# Patient Record
Sex: Female | Born: 1970 | Race: White | Hispanic: No | Marital: Married | Smoking: Never smoker
Health system: Southern US, Community
[De-identification: ages and names within clinical notes are randomized; demographics above are authoritative.]

## PROBLEM LIST (undated history)

## (undated) DIAGNOSIS — F329 Major depressive disorder, single episode, unspecified: Secondary | ICD-10-CM

## (undated) DIAGNOSIS — R7303 Prediabetes: Secondary | ICD-10-CM

## (undated) DIAGNOSIS — T8859XA Other complications of anesthesia, initial encounter: Secondary | ICD-10-CM

## (undated) DIAGNOSIS — F419 Anxiety disorder, unspecified: Secondary | ICD-10-CM

## (undated) DIAGNOSIS — M2550 Pain in unspecified joint: Secondary | ICD-10-CM

## (undated) DIAGNOSIS — E559 Vitamin D deficiency, unspecified: Secondary | ICD-10-CM

## (undated) DIAGNOSIS — K5900 Constipation, unspecified: Secondary | ICD-10-CM

## (undated) DIAGNOSIS — K219 Gastro-esophageal reflux disease without esophagitis: Secondary | ICD-10-CM

## (undated) DIAGNOSIS — F32A Depression, unspecified: Secondary | ICD-10-CM

## (undated) DIAGNOSIS — M549 Dorsalgia, unspecified: Secondary | ICD-10-CM

## (undated) DIAGNOSIS — K829 Disease of gallbladder, unspecified: Secondary | ICD-10-CM

## (undated) DIAGNOSIS — M1990 Unspecified osteoarthritis, unspecified site: Secondary | ICD-10-CM

## (undated) DIAGNOSIS — M25559 Pain in unspecified hip: Secondary | ICD-10-CM

## (undated) DIAGNOSIS — F319 Bipolar disorder, unspecified: Secondary | ICD-10-CM

## (undated) HISTORY — PX: ABDOMINAL HYSTERECTOMY: SHX81

## (undated) HISTORY — DX: Prediabetes: R73.03

## (undated) HISTORY — PX: TONSILLECTOMY: SUR1361

## (undated) HISTORY — PX: TUBAL LIGATION: SHX77

## (undated) HISTORY — DX: Constipation, unspecified: K59.00

## (undated) HISTORY — DX: Unspecified osteoarthritis, unspecified site: M19.90

## (undated) HISTORY — DX: Depression, unspecified: F32.A

## (undated) HISTORY — DX: Major depressive disorder, single episode, unspecified: F32.9

## (undated) HISTORY — DX: Pain in unspecified hip: M25.559

## (undated) HISTORY — DX: Bipolar disorder, unspecified: F31.9

## (undated) HISTORY — DX: Anxiety disorder, unspecified: F41.9

## (undated) HISTORY — DX: Vitamin D deficiency, unspecified: E55.9

## (undated) HISTORY — DX: Disease of gallbladder, unspecified: K82.9

## (undated) HISTORY — DX: Dorsalgia, unspecified: M54.9

## (undated) HISTORY — DX: Gastro-esophageal reflux disease without esophagitis: K21.9

## (undated) HISTORY — PX: NECK SURGERY: SHX720

## (undated) HISTORY — PX: CHOLECYSTECTOMY: SHX55

## (undated) HISTORY — DX: Pain in unspecified joint: M25.50

---

## 1998-11-26 ENCOUNTER — Emergency Department (HOSPITAL_COMMUNITY): Admission: EM | Admit: 1998-11-26 | Discharge: 1998-11-26 | Payer: Self-pay | Admitting: Emergency Medicine

## 2000-08-14 ENCOUNTER — Encounter: Payer: Self-pay | Admitting: Family Medicine

## 2000-08-14 ENCOUNTER — Ambulatory Visit (HOSPITAL_COMMUNITY): Admission: RE | Admit: 2000-08-14 | Discharge: 2000-08-14 | Payer: Self-pay | Admitting: Family Medicine

## 2001-02-05 ENCOUNTER — Emergency Department (HOSPITAL_COMMUNITY): Admission: EM | Admit: 2001-02-05 | Discharge: 2001-02-06 | Payer: Self-pay | Admitting: *Deleted

## 2002-03-22 ENCOUNTER — Emergency Department (HOSPITAL_COMMUNITY): Admission: EM | Admit: 2002-03-22 | Discharge: 2002-03-22 | Payer: Self-pay | Admitting: Emergency Medicine

## 2002-11-03 ENCOUNTER — Encounter: Payer: Self-pay | Admitting: Emergency Medicine

## 2002-11-03 ENCOUNTER — Emergency Department (HOSPITAL_COMMUNITY): Admission: EM | Admit: 2002-11-03 | Discharge: 2002-11-03 | Payer: Self-pay | Admitting: Emergency Medicine

## 2002-11-24 ENCOUNTER — Ambulatory Visit (HOSPITAL_COMMUNITY): Admission: RE | Admit: 2002-11-24 | Discharge: 2002-11-24 | Payer: Self-pay | Admitting: Family Medicine

## 2002-11-24 ENCOUNTER — Encounter: Payer: Self-pay | Admitting: Family Medicine

## 2003-01-14 ENCOUNTER — Ambulatory Visit (HOSPITAL_COMMUNITY): Admission: RE | Admit: 2003-01-14 | Discharge: 2003-01-14 | Payer: Self-pay | Admitting: Gastroenterology

## 2003-06-28 ENCOUNTER — Emergency Department (HOSPITAL_COMMUNITY): Admission: EM | Admit: 2003-06-28 | Discharge: 2003-06-29 | Payer: Self-pay | Admitting: Emergency Medicine

## 2003-10-01 ENCOUNTER — Encounter: Admission: RE | Admit: 2003-10-01 | Discharge: 2003-10-01 | Payer: Self-pay | Admitting: Family Medicine

## 2005-05-09 ENCOUNTER — Emergency Department (HOSPITAL_COMMUNITY): Admission: EM | Admit: 2005-05-09 | Discharge: 2005-05-10 | Payer: Self-pay | Admitting: *Deleted

## 2005-11-26 ENCOUNTER — Encounter: Admission: RE | Admit: 2005-11-26 | Discharge: 2005-11-26 | Payer: Self-pay | Admitting: Family Medicine

## 2006-05-27 ENCOUNTER — Other Ambulatory Visit: Admission: RE | Admit: 2006-05-27 | Discharge: 2006-05-27 | Payer: Self-pay | Admitting: Family Medicine

## 2006-08-23 ENCOUNTER — Emergency Department (HOSPITAL_COMMUNITY): Admission: EM | Admit: 2006-08-23 | Discharge: 2006-08-23 | Payer: Self-pay | Admitting: Emergency Medicine

## 2007-03-31 ENCOUNTER — Emergency Department (HOSPITAL_COMMUNITY): Admission: EM | Admit: 2007-03-31 | Discharge: 2007-04-01 | Payer: Self-pay | Admitting: Emergency Medicine

## 2007-04-28 ENCOUNTER — Encounter: Admission: RE | Admit: 2007-04-28 | Discharge: 2007-04-28 | Payer: Self-pay | Admitting: Gastroenterology

## 2008-02-20 ENCOUNTER — Encounter: Admission: RE | Admit: 2008-02-20 | Discharge: 2008-02-20 | Payer: Self-pay | Admitting: Family Medicine

## 2008-06-22 ENCOUNTER — Ambulatory Visit (HOSPITAL_COMMUNITY): Admission: RE | Admit: 2008-06-22 | Discharge: 2008-06-22 | Payer: Self-pay | Admitting: Gastroenterology

## 2008-08-20 ENCOUNTER — Encounter (INDEPENDENT_AMBULATORY_CARE_PROVIDER_SITE_OTHER): Payer: Self-pay | Admitting: General Surgery

## 2008-08-20 ENCOUNTER — Ambulatory Visit (HOSPITAL_COMMUNITY): Admission: RE | Admit: 2008-08-20 | Discharge: 2008-08-20 | Payer: Self-pay | Admitting: General Surgery

## 2010-02-26 ENCOUNTER — Encounter: Payer: Self-pay | Admitting: Family Medicine

## 2010-05-02 ENCOUNTER — Emergency Department (HOSPITAL_COMMUNITY)
Admission: EM | Admit: 2010-05-02 | Discharge: 2010-05-02 | Disposition: A | Discharge status: Home / Self Care | Payer: Self-pay | Attending: Emergency Medicine | Admitting: Emergency Medicine

## 2010-05-02 ENCOUNTER — Emergency Department (HOSPITAL_COMMUNITY): Payer: Self-pay

## 2010-05-02 DIAGNOSIS — R0602 Shortness of breath: Secondary | ICD-10-CM | POA: Insufficient documentation

## 2010-05-02 DIAGNOSIS — R0989 Other specified symptoms and signs involving the circulatory and respiratory systems: Secondary | ICD-10-CM | POA: Insufficient documentation

## 2010-05-02 DIAGNOSIS — Z79899 Other long term (current) drug therapy: Secondary | ICD-10-CM | POA: Insufficient documentation

## 2010-05-02 DIAGNOSIS — F411 Generalized anxiety disorder: Secondary | ICD-10-CM | POA: Insufficient documentation

## 2010-05-02 DIAGNOSIS — R110 Nausea: Secondary | ICD-10-CM | POA: Insufficient documentation

## 2010-05-02 DIAGNOSIS — R079 Chest pain, unspecified: Secondary | ICD-10-CM | POA: Insufficient documentation

## 2010-05-02 DIAGNOSIS — R0609 Other forms of dyspnea: Secondary | ICD-10-CM | POA: Insufficient documentation

## 2010-05-02 DIAGNOSIS — F319 Bipolar disorder, unspecified: Secondary | ICD-10-CM | POA: Insufficient documentation

## 2010-05-02 LAB — POCT CARDIAC MARKERS
CKMB, poc: <1 ng/mL — ABNORMAL LOW (ref 1.0–8.0)
Myoglobin, poc: 99.8 ng/mL (ref 12–200)
Troponin i, poc: <0.05 ng/mL (ref 0.00–0.09)

## 2010-05-02 LAB — DIFFERENTIAL
Basophils Absolute: 0 10*3/uL (ref 0.0–0.1)
Neutro Abs: 5.6 10*3/uL (ref 1.7–7.7)
Neutrophils Relative %: 66 % (ref 43–77)

## 2010-05-02 LAB — POCT I-STAT, CHEM 8
BUN: 6 mg/dL (ref 6–23)
Calcium, Ion: 1.17 mmol/L (ref 1.12–1.32)
Chloride: 104 mEq/L (ref 96–112)
Creatinine, Ser: 0.9 mg/dL (ref 0.4–1.2)
HCT: 43 % (ref 36.0–46.0)
Hemoglobin: 14.6 g/dL (ref 12.0–15.0)
TCO2: 25 mmol/L (ref 0–100)

## 2010-05-02 LAB — CBC
MCH: 28.9 pg (ref 26.0–34.0)
MCV: 85.1 fL (ref 78.0–100.0)
Platelets: 229 10*3/uL (ref 150–400)

## 2010-05-14 LAB — HEMOGLOBIN AND HEMATOCRIT, BLOOD: HCT: 38.4 % (ref 36.0–46.0)

## 2010-06-20 NOTE — Op Note (Signed)
NAMESYMONE, CORNMAN              ACCOUNT NO.:  [id number]   MEDICAL RECORD NO.:  [id number]          PATIENT TYPE:  AMB   LOCATION:  DAY                          FACILITY:  [facility]   PHYSICIAN:  Lennie Muckle, MD      DATE OF BIRTH:  1970-08-03   DATE OF PROCEDURE:  08/20/2008  DATE OF DISCHARGE:                               OPERATIVE REPORT   PREOPERATIVE DIAGNOSIS:  Biliary dyskinesia.   POSTOPERATIVE DIAGNOSIS:  Biliary dyskinesia.   PROCEDURE:  Laparoscopic cholecystectomy.   SURGEON:  Lennie Muckle, M.D.   ASSISTANT:  OR staff.   FINDINGS:  Adhesions to the gallbladder.   SPECIMEN:  Gallbladder.   ESTIMATED BLOOD LOSS:  Minimal amount of blood loss.   COMPLICATIONS:  No immediate complications.   INDICATIONS FOR PROCEDURE:  Christy Le is a 40 year old female who had  epigastric right upper quadrant pain for several years.  She had had an  endoscopy which was essentially normal except for mild gastritis.  Workup revealed an ultrasound with no stones with a HIDA scan with  ejection fraction of 27%.  Symptoms have become slightly more frequent  in nature.  They did seem consistent with biliary dyskinesia.  I  discussed with Christy Le that I thought she would benefit from a  cholecystectomy with the risk only 10%, the patient __________  responding to removal of the gallbladder.  All questions were answered.  Informed consent was obtained prior to procedure.   DESCRIPTION OF PROCEDURE:  Christy Le was identified in the preoperative  holding area.  She was then taken to the operating room.  Once in the  operating room,  placed in supine position.  After administration of  general endotracheal anesthesia, SCDs were applied to her lower  extremity.  Her abdomen was then prepped and draped in the usual sterile  fashion.  A time-out procedure and identification of patient and  procedure were performed.  I placed an incision in the right upper  quadrant using the 5 mm  trocar in the Optivus, placed the trocar into  the abdominal cavity.  All layers of abdominal wall were visualized upon  entry.  After insufflating the abdomen, I inspected the abdomen.  I  found no evidence of injury upon placement of the trocar.  I then looked  towards the umbilical region where she had had a previous incision from  her tubal ligation.  No adhesions were found here.  I then placed 11 mm  trocar under direct visualization with the camera.  I then moved the  camera in the umbilical region, placed two additional trocars in the  right upper quadrant under visualization with the camera.  Gallbladder  was identified in the normal anatomic position.  I grasped the fundus,  retracted this to this to the head of the patient.  Retracted the  infundibulum away from the liver bed using the Maryland forceps  electrocautery, dissected the cystic duct and artery to obtain a  critical view.  Once a critical view was obtained. I clipped and divided  the cystic duct and artery without difficulty.  The remaining peritoneal  attachments removed with electrocautery.  The gallbladder was placed in  an EndoCatch bag and removed from the abdomen.  I inspected the liver  bed and found no evidence of bleeding.  I closed the fascial defect at  the umbilicus using an 0 Vicryl suture and laparoscopic suture passer.  I then inspected the abdomen and found no evidence of bleeding.  No  evidence of injury.  Thus removed the pneumoperitoneum, removed the  trocar and closed the skin with 4-0 Monocryl.  Dermabond was placed as  final dressing.  The patient was then extubated, transferred to post  anesthesia care unit in stable condition.  She will be monitored for a  short while and likely discharged home later today.   She will be given #48 Percocet for pain and follow up with me in 2 to 3  weeks.   __________      Lennie Muckle, MD  Electronically Signed     ALA/MEDQ  D:  08/20/2008  T:   08/20/2008  Job:  [id number]

## 2010-06-23 NOTE — Op Note (Signed)
NAME:  Christy Le, Christy Le                        ACCOUNT NO.:  [id number]   MEDICAL RECORD NO.:  [id number]                   PATIENT TYPE:  AMB   LOCATION:  ENDO                                 FACILITY:  [facility]   PHYSICIAN:  Graylin Shiver, M.D.                DATE OF BIRTH:  [birth year]   DATE OF PROCEDURE:  01/14/2003  DATE OF DISCHARGE:                                 OPERATIVE REPORT   PROCEDURE:  Esophagogastroduodenoscopy with biopsy for CLOtest.   INDICATIONS FOR PROCEDURE:  Upper abdominal pain.   Informed consent was obtained after explanation of the risks of bleeding,  infection and perforation.   PREMEDICATION:  Fentanyl 50 mcg IV, Versed 7 mg IV.   DESCRIPTION OF PROCEDURE:  With the patient in the left lateral decubitus  position, the Olympus gastroscope was inserted into the oropharynx and  passed into the esophagus.  It was advanced down the esophagus and then into  the stomach and into the duodenum.  The second portion and bulb of the  duodenum looked normal.  The stomach showed some erythema in the antrum  compatible with antral gastritis.  Biopsies for CLOtest were obtained.  The  body of the stomach looked normal, the fundus and cardia looked normal.  The  esophagus looked normal in its entirety with the esophagogastric junction  being at 39 cm.  She tolerated the procedure well without complications.   IMPRESSION:  Mild antral gastritis.   PLAN:  The CLOtest will be checked and if positive it will be treated.                                               Graylin Shiver, M.D.    Christy Le  D:  01/14/2003  T:  01/14/2003  Job:  [id number]   cc:   Meredith Staggers, M.D.  [address]  [place]  [place] [address]  Fax: [phone]

## 2010-07-14 ENCOUNTER — Other Ambulatory Visit: Payer: Self-pay | Admitting: Physician Assistant

## 2010-07-14 ENCOUNTER — Other Ambulatory Visit (HOSPITAL_COMMUNITY)
Admission: RE | Admit: 2010-07-14 | Discharge: 2010-07-14 | Disposition: A | Discharge status: Home / Self Care | Payer: Self-pay | Source: Ambulatory Visit | Attending: Family Medicine | Admitting: Family Medicine

## 2010-07-14 DIAGNOSIS — Z01419 Encounter for gynecological examination (general) (routine) without abnormal findings: Secondary | ICD-10-CM | POA: Insufficient documentation

## 2010-07-14 DIAGNOSIS — Z113 Encounter for screening for infections with a predominantly sexual mode of transmission: Secondary | ICD-10-CM | POA: Insufficient documentation

## 2010-10-30 LAB — CBC
MCHC: 34.2
MCV: 83.9
RBC: 4.77
RDW: 13.2
WBC: 11.8 — ABNORMAL HIGH

## 2010-10-30 LAB — URINALYSIS, ROUTINE W REFLEX MICROSCOPIC
Bilirubin Urine: NEGATIVE
Glucose, UA: NEGATIVE
Hgb urine dipstick: NEGATIVE
Ketones, ur: 15 — AB
Nitrite: NEGATIVE
Protein, ur: NEGATIVE
Specific Gravity, Urine: 1.014
Urobilinogen, UA: 0.2
pH: 7

## 2010-10-30 LAB — I-STAT 8, (EC8 V) (CONVERTED LAB)
Acid-Base Excess: 1
Bicarbonate: 26.4 — ABNORMAL HIGH
Hemoglobin: 15
Potassium: 3.8
Sodium: 138
pCO2, Ven: 45.1

## 2010-10-30 LAB — PREGNANCY, URINE: Preg Test, Ur: NEGATIVE

## 2010-10-30 LAB — DIFFERENTIAL
Basophils Absolute: 0.1
Eosinophils Relative: 1
Monocytes Relative: 8
Neutro Abs: 8.3 — ABNORMAL HIGH
Neutrophils Relative %: 70

## 2010-10-30 LAB — POCT I-STAT CREATININE: Creatinine, Ser: 1.1

## 2010-10-30 LAB — POCT CARDIAC MARKERS: CKMB, poc: <1 — ABNORMAL LOW

## 2011-02-16 ENCOUNTER — Emergency Department (HOSPITAL_BASED_OUTPATIENT_CLINIC_OR_DEPARTMENT_OTHER)
Admission: EM | Admit: 2011-02-16 | Discharge: 2011-02-16 | Disposition: A | Discharge status: Home / Self Care | Payer: Self-pay | Attending: Emergency Medicine | Admitting: Emergency Medicine

## 2011-02-16 ENCOUNTER — Emergency Department (INDEPENDENT_AMBULATORY_CARE_PROVIDER_SITE_OTHER): Payer: Self-pay

## 2011-02-16 ENCOUNTER — Encounter (HOSPITAL_BASED_OUTPATIENT_CLINIC_OR_DEPARTMENT_OTHER): Payer: Self-pay | Admitting: *Deleted

## 2011-02-16 DIAGNOSIS — R05 Cough: Secondary | ICD-10-CM | POA: Insufficient documentation

## 2011-02-16 DIAGNOSIS — R059 Cough, unspecified: Secondary | ICD-10-CM | POA: Insufficient documentation

## 2011-02-16 DIAGNOSIS — R509 Fever, unspecified: Secondary | ICD-10-CM

## 2011-02-16 DIAGNOSIS — R0989 Other specified symptoms and signs involving the circulatory and respiratory systems: Secondary | ICD-10-CM

## 2011-02-16 DIAGNOSIS — J40 Bronchitis, not specified as acute or chronic: Secondary | ICD-10-CM | POA: Insufficient documentation

## 2011-02-16 MED ORDER — ALBUTEROL SULFATE HFA 108 (90 BASE) MCG/ACT IN AERS: 1.0000 | INHALATION_SPRAY | Freq: Four times a day (QID) | RESPIRATORY_TRACT | Status: DC | PRN

## 2011-02-16 MED ORDER — HYDROCOD POLST-CHLORPHEN POLST 10-8 MG/5ML PO LQCR: 5.0000 mL | Freq: Two times a day (BID) | ORAL | Status: DC

## 2011-02-16 MED ORDER — AZITHROMYCIN 250 MG PO TABS: 250.0000 mg | ORAL_TABLET | Freq: Every day | ORAL | Status: AC

## 2011-02-16 NOTE — ED Provider Notes (Signed)
History     CSN: [id number]  Arrival date & time 02/16/11  1407   First MD Initiated Contact with Patient 02/16/11 1449      Chief Complaint  Patient presents with  . URI    (Consider location/radiation/quality/duration/timing/severity/associated sxs/prior treatment) Patient is a 41 y.o. female presenting with cough. The history is provided by the patient. No language interpreter was used.  Cough This is a new problem. The current episode started more than 1 week ago. The problem occurs constantly. The problem has been gradually worsening. The cough is productive of sputum. The maximum temperature recorded prior to her arrival was 100 to 100.9 F. The fever has been present for 1 to 2 days. Associated symptoms include chest pain, chills, ear congestion, ear pain, headaches, rhinorrhea, sore throat and myalgias. She has tried decongestants for the symptoms. The treatment provided no relief. Risk factors: no flu shot. Her past medical history does not include pneumonia.    History reviewed. No pertinent past medical history.  Past Surgical History  Procedure Date  . Cholecystectomy   . Tubal ligation     History reviewed. No pertinent family history.  History  Substance Use Topics  . Smoking status: Never Smoker   . Smokeless tobacco: Not on file  . Alcohol Use: No    OB History    Grav Para Term Preterm Abortions TAB SAB Ect Mult Living                  Review of Systems  Constitutional: Positive for chills.  HENT: Positive for ear pain, sore throat and rhinorrhea.   Respiratory: Positive for cough.   Cardiovascular: Positive for chest pain.  Musculoskeletal: Positive for myalgias.  Neurological: Positive for headaches.  All other systems reviewed and are negative.    Allergies  Review of patient's allergies indicates no known allergies.  Home Medications  No current outpatient prescriptions on file.  BP 117/66  Pulse 100  Temp(Src) 100.3 F (37.9 C)  (Oral)  Resp 18  Ht 5\' 9"  (1.753 m)  Wt 195 lb (88.451 kg)  BMI 28.80 kg/m2  SpO2 100%  LMP 02/06/2011  Physical Exam  Vitals reviewed. Constitutional: She is oriented to person, place, and time. She appears well-developed and well-nourished.  HENT:  Head: Normocephalic and atraumatic.  Right Ear: External ear normal.  Left Ear: External ear normal.  Nose: Nose normal.  Mouth/Throat: Oropharynx is clear and moist.  Eyes: EOM are normal. Pupils are equal, round, and reactive to light.  Neck: Neck supple.  Cardiovascular: Normal rate and normal heart sounds.   Pulmonary/Chest: Effort normal.  Abdominal: Soft. Bowel sounds are normal.  Musculoskeletal: Normal range of motion.  Neurological: She is alert and oriented to person, place, and time. She has normal reflexes.  Skin: Skin is warm.  Psychiatric: She has a normal mood and affect.    ED Course  Procedures (including critical care time)  Labs Reviewed - No data to display Dg Chest 2 View  02/16/2011  *RADIOLOGY REPORT*  Clinical Data: Cough, congestion, fever.  CHEST - 2 VIEW  Comparison: 05/02/2010  Findings: Heart and mediastinal contours are within normal limits. No focal opacities or effusions.  No acute bony abnormality.  IMPRESSION: No active cardiopulmonary disease.  Original Report Authenticated By: Cyndie Chime, M.D.     No diagnosis found.    MDM  I will treat for bronchitis.  Pt given rx for zithromax and tussionex.   Pt given albuterol inhaler  here.        Langston Masker, PA 02/16/11 1502

## 2011-02-16 NOTE — ED Notes (Signed)
Pt c/o flu like symptoms x  5 days 

## 2011-02-17 NOTE — ED Provider Notes (Signed)
Medical screening examination/treatment/procedure(s) were performed by non-physician practitioner and as supervising physician I was immediately available for consultation/collaboration.  Mardie Kellen R. Kendre Sires, MD 02/17/11 0715 

## 2013-07-07 ENCOUNTER — Encounter (INDEPENDENT_AMBULATORY_CARE_PROVIDER_SITE_OTHER): Payer: Self-pay

## 2013-07-07 ENCOUNTER — Ambulatory Visit (INDEPENDENT_AMBULATORY_CARE_PROVIDER_SITE_OTHER): Payer: BC Managed Care – PPO | Admitting: Internal Medicine

## 2013-07-07 ENCOUNTER — Encounter: Payer: Self-pay | Admitting: Internal Medicine

## 2013-07-07 VITALS — BP 114/74 | HR 68 | Temp 98.8°F | Ht 68.33 in | Wt 205.0 lb

## 2013-07-07 DIAGNOSIS — A499 Bacterial infection, unspecified: Secondary | ICD-10-CM

## 2013-07-07 DIAGNOSIS — M129 Arthropathy, unspecified: Secondary | ICD-10-CM

## 2013-07-07 DIAGNOSIS — N760 Acute vaginitis: Secondary | ICD-10-CM

## 2013-07-07 DIAGNOSIS — M1990 Unspecified osteoarthritis, unspecified site: Secondary | ICD-10-CM

## 2013-07-07 DIAGNOSIS — B9689 Other specified bacterial agents as the cause of diseases classified elsewhere: Secondary | ICD-10-CM

## 2013-07-07 DIAGNOSIS — K219 Gastro-esophageal reflux disease without esophagitis: Secondary | ICD-10-CM

## 2013-07-07 DIAGNOSIS — Z0000 Encounter for general adult medical examination without abnormal findings: Secondary | ICD-10-CM

## 2013-07-07 MED ORDER — METRONIDAZOLE 0.75 % VA GEL: 1.0000 | Freq: Two times a day (BID) | VAGINAL | Status: DC

## 2013-07-07 NOTE — Progress Notes (Signed)
Pre visit review using our clinic review tool, if applicable. No additional management support is needed unless otherwise documented below in the visit note. 

## 2013-07-07 NOTE — Patient Instructions (Addendum)

## 2013-07-07 NOTE — Progress Notes (Addendum)
HPI  Pt presents to the clinic today to establish care. She has not had a PCP in many years. She does have some concerns today.  1- Pain in the joint on her left hand. Feels very stiff in the morning. Achy and throbbing at times. She has not tried anything OTC.  2- GERD. She knows which foods trigger her reflux. She does try to avoid them. She has gained 20 lbs in the last year.  3- Vaginal odor. Started a few weeks ago. She does not take bubble baths or douche. She has not tried anything OTC.  Flu: never Tetanus:: unsure LMP: Pap Smear: 2012 Mammogram: more than 2 years ago Eye Doctor: yearly Dentist: as needed  Past Medical History  Diagnosis Date  . Arthritis   . GERD (gastroesophageal reflux disease)   . Depression     Current Outpatient Prescriptions  Medication Sig Dispense Refill  . aspirin 81 MG tablet Take 81 mg by mouth daily.      . Glucosamine-Chondroit-Vit C-Mn (GLUCOSAMINE 1500 COMPLEX PO) Take 1 capsule by mouth daily.      . Multiple Vitamin (MULTIVITAMIN) capsule Take 1 capsule by mouth daily.       No current facility-administered medications for this visit.    No Known Allergies  Family History  Problem Relation Age of Onset  . Hypertension Mother   . Mental retardation Mother   . Heart disease Father   . Congestive Heart Failure Father   . Cancer Maternal Grandfather     Lung  . Hypertension Maternal Grandfather   . Heart disease Paternal Grandfather   . Hypertension Paternal Grandfather     History   Social History  . Marital Status: Married    Spouse Name: N/A    Number of Children: N/A  . Years of Education: N/A   Occupational History  . Not on file.   Social History Main Topics  . Smoking status: Never Smoker   . Smokeless tobacco: Not on file  . Alcohol Use: Yes     Comment: occasional  . Drug Use: No  . Sexual Activity: Yes    Birth Control/ Protection: Surgical   Other Topics Concern  . Not on file   Social History  Narrative  . No narrative on file    ROS:  Constitutional: Denies fever, malaise, fatigue, headache or abrupt weight changes.  HEENT: Denies eye pain, eye redness, ear pain, ringing in the ears, wax buildup, runny nose, nasal congestion, bloody nose, or sore throat. Respiratory: Denies difficulty breathing, shortness of breath, cough or sputum production.   Cardiovascular: Denies chest pain, chest tightness, palpitations or swelling in the hands or feet.  Gastrointestinal: Pt reports reflux. Denies abdominal pain, bloating, constipation, diarrhea or blood in the stool.  GU: Pt reports vaginal odor. Denies frequency, urgency, pain with urination, blood in urine, or discharge. Musculoskeletal: Pt reports joint pain and swelling. Denies decrease in range of motion, difficulty with gait, muscle pain.  Skin: Denies redness, rashes, lesions or ulcercations.  Neurological: Denies dizziness, difficulty with memory, difficulty with speech or problems with balance and coordination.   No other specific complaints in a complete review of systems (except as listed in HPI above).  PE:  BP 114/74  Pulse 68  Temp(Src) 98.8 F (37.1 C) (Oral)  Ht 5' 8.33" (1.736 m)  Wt 205 lb (92.987 kg)  BMI 30.85 kg/m2  SpO2 99%  LMP 06/22/2013 Wt Readings from Last 3 Encounters:  07/07/13 205 lb (92.987  kg)  02/16/11 195 lb (88.451 kg)    General: Appears her stated age, overweight but well developed, well nourished in NAD. HEENT: Head: normal shape and size; Eyes: sclera white, no icterus, conjunctiva pink, PERRLA and EOMs intact; Ears: Tm's gray and intact, normal light reflex; Nose: mucosa pink and moist, septum midline; Throat/Mouth: Teeth present, mucosa pink and moist, no lesions or ulcerations noted.  Neck: Normal range of motion. Neck supple, trachea midline. No massses, lumps or thyromegaly present.  Cardiovascular: Normal rate and rhythm. S1,S2 noted.  No murmur, rubs or gallops noted. No JVD or BLE  edema. No carotid bruits noted. Pulmonary/Chest: Normal effort and positive vesicular breath sounds. No respiratory distress. No wheezes, rales or ronchi noted.  Abdomen: Soft and nontender. Normal bowel sounds, no bruits noted. No distention or masses noted. Liver, spleen and kidneys non palpable. Pelvic: Normal female anatomy. No discharge noted. No labial irritation noted.  Musculoskeletal: Normal range of motion. Tender to palpation of the proximal joint of right pinky. No signs of joint swelling. No difficulty with gait.  Neurological: Alert and oriented. Cranial nerves II-XII intact. Coordination normal. +DTRs bilaterally. Psychiatric: Mood and affect normal. Behavior is normal. Judgment and thought content normal.     BMET    Component Value Date/Time   NA 142 05/02/2010 1538   K 3.5 05/02/2010 1538   CL 104 05/02/2010 1538   GLUCOSE 97 05/02/2010 1538   BUN 6 05/02/2010 1538   CREATININE 0.9 05/02/2010 1538    Lipid Panel  No results found for this basename: chol, trig, hdl, cholhdl, vldl, ldlcalc    CBC    Component Value Date/Time   WBC 8.5 05/02/2010 1532   RBC 4.63 05/02/2010 1532   HGB 14.6 05/02/2010 1538   HCT 43.0 05/02/2010 1538   PLT 229 05/02/2010 1532   MCV 85.1 05/02/2010 1532   MCH 28.9 05/02/2010 1532   MCHC 34.0 05/02/2010 1532   RDW 13.8 05/02/2010 1532   LYMPHSABS 2.2 05/02/2010 1532   MONOABS 0.6 05/02/2010 1532   EOSABS 0.1 05/02/2010 1532   BASOSABS 0.0 05/02/2010 1532    Hgb A1C No results found for this basename: HGBA1C     Assessment and Plan:  Preventative Health Maintenance:  Encouraged her to work on diet and exercise Will obtain basic screening labs today  Arthritis:  Try aleve prn  GERD:  Try OTC zantac  Vaginal odor:  Wet prep: pos clue, neg trich, neg clue eRx for metrogel  RTC for your pap smear

## 2013-07-08 LAB — CBC
HCT: 39.9 % (ref 36.0–46.0)
HEMOGLOBIN: 13.4 g/dL (ref 12.0–15.0)
MCHC: 33.5 g/dL (ref 30.0–36.0)
MCV: 85.9 fl (ref 78.0–100.0)
PLATELETS: 258 10*3/uL (ref 150.0–400.0)
RBC: 4.65 Mil/uL (ref 3.87–5.11)
RDW: 13.5 % (ref 11.5–15.5)
WBC: 8.6 10*3/uL (ref 4.0–10.5)

## 2013-07-08 LAB — LIPID PANEL
CHOL/HDL RATIO: 3
CHOLESTEROL: 154 mg/dL (ref 0–200)
HDL: 49.8 mg/dL (ref >39.00)
LDL Cholesterol: 86 mg/dL (ref 0–99)
NonHDL: 104.2
TRIGLYCERIDES: 93 mg/dL (ref 0.0–149.0)
VLDL: 18.6 mg/dL (ref 0.0–40.0)

## 2013-07-08 LAB — COMPREHENSIVE METABOLIC PANEL
ALT: 9 U/L (ref 0–35)
AST: 15 U/L (ref 0–37)
Albumin: 4.2 g/dL (ref 3.5–5.2)
Alkaline Phosphatase: 41 U/L (ref 39–117)
BUN: 19 mg/dL (ref 6–23)
CALCIUM: 9.3 mg/dL (ref 8.4–10.5)
CHLORIDE: 106 meq/L (ref 96–112)
CO2: 27 meq/L (ref 19–32)
CREATININE: 0.8 mg/dL (ref 0.4–1.2)
GFR: 81.5 mL/min (ref >60.00)
Glucose, Bld: 100 mg/dL — ABNORMAL HIGH (ref 70–99)
Potassium: 4.1 mEq/L (ref 3.5–5.1)
Sodium: 138 mEq/L (ref 135–145)
Total Bilirubin: 0.5 mg/dL (ref 0.2–1.2)
Total Protein: 7.1 g/dL (ref 6.0–8.3)

## 2013-07-08 LAB — TSH: TSH: 0.83 u[IU]/mL (ref 0.35–4.50)

## 2013-07-08 LAB — HEMOGLOBIN A1C: Hgb A1c MFr Bld: 5.6 % (ref 4.6–6.5)

## 2013-07-14 ENCOUNTER — Encounter: Payer: BC Managed Care – PPO | Admitting: Internal Medicine

## 2013-07-29 ENCOUNTER — Telehealth: Payer: Self-pay

## 2013-07-29 NOTE — Telephone Encounter (Signed)
Pt was seen on 07/07/13; pt has GERD, pt has taken OTC Prevacid and not sure of mg. Pt is also using Tums without relief. Pt has not taken Zantac but has taken other OTC meds. Pt seems to get GERD symptoms no matter what pt eats. Pt is not having CP but feels like stomach is on fire and fire feeling goes all the way up to throat. Pt cancelled CPX on 07/31/13 because [facility]. Pt wants to know is there a med that could be [facility] for [facility] relief of the burning sensation in stomach. [facility]. Pt request cb.

## 2013-07-29 NOTE — Telephone Encounter (Signed)
She would need to be seen for prescription RX but can try Nexium 2 tabs (40 mg ) to see if this helps

## 2013-07-29 NOTE — Telephone Encounter (Signed)
Pt is aware as instructed--pt will f/u if needed

## 2013-07-31 ENCOUNTER — Encounter: Payer: BC Managed Care – PPO | Admitting: Internal Medicine

## 2013-12-24 ENCOUNTER — Other Ambulatory Visit: Payer: Self-pay | Admitting: Obstetrics & Gynecology

## 2013-12-24 DIAGNOSIS — N63 Unspecified lump in unspecified breast: Secondary | ICD-10-CM

## 2013-12-28 LAB — CYTOLOGY - PAP

## 2014-01-08 ENCOUNTER — Ambulatory Visit
Admission: RE | Admit: 2014-01-08 | Discharge: 2014-01-08 | Disposition: A | Discharge status: Home / Self Care | Payer: BC Managed Care – PPO | Source: Ambulatory Visit | Attending: Obstetrics & Gynecology | Admitting: Obstetrics & Gynecology

## 2014-01-08 DIAGNOSIS — N63 Unspecified lump in unspecified breast: Secondary | ICD-10-CM

## 2017-03-11 ENCOUNTER — Ambulatory Visit: Payer: Self-pay | Admitting: Allergy and Immunology

## 2018-03-12 ENCOUNTER — Emergency Department (HOSPITAL_COMMUNITY): Payer: 59

## 2018-03-12 ENCOUNTER — Encounter (HOSPITAL_COMMUNITY): Payer: Self-pay | Admitting: Emergency Medicine

## 2018-03-12 ENCOUNTER — Emergency Department (HOSPITAL_COMMUNITY)
Admission: EM | Admit: 2018-03-12 | Discharge: 2018-03-12 | Disposition: A | Discharge status: Home / Self Care | Payer: 59 | Attending: Emergency Medicine | Admitting: Emergency Medicine

## 2018-03-12 DIAGNOSIS — Z79899 Other long term (current) drug therapy: Secondary | ICD-10-CM | POA: Diagnosis not present

## 2018-03-12 DIAGNOSIS — R0789 Other chest pain: Secondary | ICD-10-CM | POA: Diagnosis not present

## 2018-03-12 DIAGNOSIS — R079 Chest pain, unspecified: Secondary | ICD-10-CM | POA: Diagnosis present

## 2018-03-12 DIAGNOSIS — F329 Major depressive disorder, single episode, unspecified: Secondary | ICD-10-CM | POA: Diagnosis not present

## 2018-03-12 DIAGNOSIS — Z9049 Acquired absence of other specified parts of digestive tract: Secondary | ICD-10-CM | POA: Insufficient documentation

## 2018-03-12 LAB — CBC
HCT: 40.7 % (ref 36.0–46.0)
Hemoglobin: 12.9 g/dL (ref 12.0–15.0)
MCH: 28.5 pg (ref 26.0–34.0)
MCHC: 31.7 g/dL (ref 30.0–36.0)
MCV: 89.8 fL (ref 80.0–100.0)
NRBC: 0 % (ref 0.0–0.2)
PLATELETS: 249 10*3/uL (ref 150–400)
RBC: 4.53 MIL/uL (ref 3.87–5.11)
RDW: 13.4 % (ref 11.5–15.5)
WBC: 7.9 10*3/uL (ref 4.0–10.5)

## 2018-03-12 LAB — BASIC METABOLIC PANEL
Anion gap: 6 (ref 5–15)
BUN: 14 mg/dL (ref 6–20)
CALCIUM: 9.4 mg/dL (ref 8.9–10.3)
CO2: 27 mmol/L (ref 22–32)
CREATININE: 0.91 mg/dL (ref 0.44–1.00)
Chloride: 105 mmol/L (ref 98–111)
Glucose, Bld: 93 mg/dL (ref 70–99)
Potassium: 3.5 mmol/L (ref 3.5–5.1)
SODIUM: 138 mmol/L (ref 135–145)

## 2018-03-12 LAB — I-STAT BETA HCG BLOOD, ED (NOT ORDERABLE): I-stat hCG, quantitative: <5 m[IU]/mL (ref <5)

## 2018-03-12 LAB — POCT I-STAT TROPONIN I: TROPONIN I, POC: 0 ng/mL (ref 0.00–0.08)

## 2018-03-12 LAB — LIPASE, BLOOD: LIPASE: 29 U/L (ref 11–51)

## 2018-03-12 LAB — D-DIMER, QUANTITATIVE: D-Dimer, Quant: <0.27 ug/mL-FEU (ref 0.00–0.50)

## 2018-03-12 LAB — TROPONIN I: Troponin I: <0.03 ng/mL (ref <0.03)

## 2018-03-12 MED ORDER — IOHEXOL 300 MG/ML  SOLN
75.0000 mL | Freq: Once | INTRAMUSCULAR | Status: AC | PRN
  Administered 2018-03-12: 75 mL via INTRAVENOUS

## 2018-03-12 MED ORDER — KETOROLAC TROMETHAMINE 30 MG/ML IJ SOLN
30.0000 mg | Freq: Once | INTRAMUSCULAR | Status: AC
  Administered 2018-03-12: 30 mg via INTRAVENOUS
  Filled 2018-03-12: qty 1

## 2018-03-12 MED ORDER — ONDANSETRON HCL 4 MG/2ML IJ SOLN
4.0000 mg | Freq: Once | INTRAMUSCULAR | Status: AC
  Administered 2018-03-12: 4 mg via INTRAVENOUS
  Filled 2018-03-12: qty 2

## 2018-03-12 MED ORDER — HYDROMORPHONE HCL 1 MG/ML IJ SOLN
1.0000 mg | Freq: Once | INTRAMUSCULAR | Status: AC
  Administered 2018-03-12: 1 mg via INTRAVENOUS
  Filled 2018-03-12: qty 1

## 2018-03-12 MED ORDER — HYDROCODONE-ACETAMINOPHEN 5-325 MG PO TABS: 1.0000 | ORAL_TABLET | ORAL | 0 refills | Status: DC | PRN

## 2018-03-12 MED ORDER — ALUM & MAG HYDROXIDE-SIMETH 200-200-20 MG/5ML PO SUSP
30.0000 mL | Freq: Once | ORAL | Status: AC
  Administered 2018-03-12: 30 mL via ORAL
  Filled 2018-03-12: qty 30

## 2018-03-12 MED ORDER — SODIUM CHLORIDE 0.9% FLUSH
3.0000 mL | Freq: Once | INTRAVENOUS | Status: AC
  Administered 2018-03-12: 3 mL via INTRAVENOUS

## 2018-03-12 MED ORDER — LIDOCAINE VISCOUS HCL 2 % MT SOLN
15.0000 mL | Freq: Once | OROMUCOSAL | Status: AC
  Administered 2018-03-12: 15 mL via ORAL
  Filled 2018-03-12: qty 15

## 2018-03-12 MED ORDER — SODIUM CHLORIDE (PF) 0.9 % IJ SOLN
INTRAMUSCULAR | Status: AC
  Filled 2018-03-12: qty 50

## 2018-03-12 MED ORDER — PREDNISONE 10 MG (21) PO TBPK: ORAL_TABLET | ORAL | 0 refills | Status: DC

## 2018-03-12 MED ORDER — MORPHINE SULFATE (PF) 4 MG/ML IV SOLN
4.0000 mg | Freq: Once | INTRAVENOUS | Status: AC
  Administered 2018-03-12: 4 mg via INTRAVENOUS
  Filled 2018-03-12: qty 1

## 2018-03-12 NOTE — ED Triage Notes (Signed)
Patient here from home with complaints of central chest pain x1 week radiating to left shoulder. Nausea, no vomiting.

## 2018-03-12 NOTE — ED Provider Notes (Signed)
[facility]-EMERGENCY DEPT Provider Note   CSN: [id number] Arrival date & time: 03/12/18  1417     History   Chief Complaint Chief Complaint  Patient presents with  . Chest Pain    HPI Christy Le is a 48 y.o. female.  Pt presents to the ED today with cp.  It has been intermittent for 1 week.  The pt said it feels like a pressure sensation.  She said it radiates to her left shoulder.  No vomiting, but she does have nausea.  No sob.  Nothing makes it worse or better.  No cardiac hx.     Past Medical History:  Diagnosis Date  . Arthritis   . Depression   . GERD (gastroesophageal reflux disease)     There are no active problems to display for this patient.   Past Surgical History:  Procedure Laterality Date  . CHOLECYSTECTOMY    . TUBAL LIGATION       OB History   No obstetric history on file.      Home Medications    Prior to Admission medications   Medication Sig Start Date End Date Taking? Authorizing Provider  aspirin 325 MG tablet Take 325 mg by mouth daily as needed (chest pain).    Yes [provider]  Omega 3-6-9 Fatty Acids (TRIPLE OMEGA-3-6-9 PO) Take 1 tablet by mouth daily.   Yes [provider]  VITAMIN D PO Take 1 tablet by mouth daily.   Yes [provider]  HYDROcodone-acetaminophen (NORCO/VICODIN) 5-325 MG tablet Take 1 tablet by mouth every 4 (four) hours as needed. 03/12/18   Jacalyn LefevreHaviland, Kelvis Berger, MD  metroNIDAZOLE (METROGEL VAGINAL) 0.75 % vaginal gel Place 1 Applicatorful vaginally 2 (two) times daily. Patient not taking: Reported on 03/12/2018 07/07/13   Lorre MunroeBaity, Regina W, NP  predniSONE (STERAPRED UNI-PAK 21 TAB) 10 MG (21) TBPK tablet Take 6 tabs for 2 days, then 5 for 2 days, then 4 for 2 days, then 3 for 2 days, 2 for 2 days, then 1 for 2 days 03/12/18   Jacalyn LefevreHaviland, Aminat Shelburne, MD    Family History Family History  Problem Relation Age of Onset  . Hypertension Mother   . Mental retardation Mother   .  Heart disease Father   . Congestive Heart Failure Father   . Cancer Maternal Grandfather        Lung  . Hypertension Maternal Grandfather   . Heart disease Paternal Grandfather   . Hypertension Paternal Grandfather     Social History Social History   Tobacco Use  . Smoking status: Never Smoker  . Smokeless tobacco: Never Used  Substance Use Topics  . Alcohol use: Yes    Comment: occasional  . Drug use: No     Allergies   Patient has no known allergies.   Review of Systems Review of Systems  Cardiovascular: Positive for chest pain.  Gastrointestinal: Positive for nausea.  All other systems reviewed and are negative.    Physical Exam Updated Vital Signs BP 131/77   Pulse 74   Temp 98.2 F (36.8 C) (Oral)   Resp 18   LMP 02/26/2018   SpO2 98%   Physical Exam Vitals signs and nursing note reviewed.  Constitutional:      Appearance: She is well-developed.  HENT:     Head: Normocephalic and atraumatic.  Eyes:     Extraocular Movements: Extraocular movements intact.     Pupils: Pupils are equal, round, and reactive to light.  Neck:     Musculoskeletal: Normal range of motion and neck supple.  Cardiovascular:     Rate and Rhythm: Normal rate and regular rhythm.     Heart sounds: Normal heart sounds.  Pulmonary:     Effort: Pulmonary effort is normal.     Breath sounds: Normal breath sounds.  Abdominal:     General: Bowel sounds are normal.     Palpations: Abdomen is soft.  Musculoskeletal: Normal range of motion.  Skin:    General: Skin is warm and dry.     Capillary Refill: Capillary refill takes less than 2 seconds.  Neurological:     General: No focal deficit present.     Mental Status: She is alert and oriented to person, place, and time.  Psychiatric:        Mood and Affect: Mood normal.        Behavior: Behavior normal.      ED Treatments / Results  Labs (all labs ordered are listed, but only abnormal results are displayed) Labs Reviewed    BASIC METABOLIC PANEL  CBC  D-DIMER, QUANTITATIVE (NOT AT [facility])  TROPONIN I  LIPASE, BLOOD  I-STAT TROPONIN, ED  I-STAT BETA HCG BLOOD, ED ([facility], [facility], [facility] ONLY)  POCT I-STAT TROPONIN I  I-STAT BETA HCG BLOOD, ED (NOT ORDERABLE)    EKG EKG Interpretation  Date/Time:  Wednesday March 12 2018 14:27:28 EST Ventricular Rate:  62 PR Interval:    QRS Duration: 84 QT Interval:  399 QTC Calculation: 406 R Axis:   55 Text Interpretation:  Sinus rhythm Low voltage, precordial leads No significant change since last tracing Confirmed by Jacalyn Lefevre [phone]) on 03/12/2018 4:57:54 PM   Radiology Dg Chest 2 View  Result Date: 03/12/2018 CLINICAL DATA:  48 year old female with chest pain for 1 week radiating to the left shoulder with nausea. EXAM: CHEST - 2 VIEW COMPARISON:  12/07/2011 chest radiographs. FINDINGS: The heart size and mediastinal contours are within normal limits. Both lungs are clear. Visualized tracheal air column is within normal limits. No pneumothorax or pleural effusion. Negative visible bowel gas pattern. Stable cholecystectomy clips. No acute osseous abnormality identified. IMPRESSION: Negative.  No cardiopulmonary abnormality. Electronically Signed   By: Odessa Fleming M.D.   On: 03/12/2018 15:50   Ct Chest W Contrast  Result Date: 03/12/2018 CLINICAL DATA:  Right-sided chest pain under the right breast radiating into the back. EXAM: CT CHEST WITH CONTRAST TECHNIQUE: Multidetector CT imaging of the chest was performed during intravenous contrast administration. CONTRAST:  42mL OMNIPAQUE IOHEXOL 300 MG/ML  SOLN COMPARISON:  03/12/2018 chest radiograph FINDINGS: Cardiovascular: No significant vascular findings. Normal heart size. No pericardial effusion. Mediastinum/Nodes: No enlarged mediastinal, hilar, or axillary lymph nodes. Thyroid gland, trachea, and esophagus demonstrate no significant findings. Lungs/Pleura: Lungs are clear. No pleural effusion or pneumothorax. Upper Abdomen:  Cholecystectomy. Musculoskeletal: No chest wall abnormality. No acute or significant osseous findings. IMPRESSION: No acute process identified.  Negative CT of the chest. Electronically Signed   By: Mitzi Hansen M.D.   On: 03/12/2018 20:53    Procedures Procedures (including critical care time)  Medications Ordered in ED Medications  sodium chloride flush (NS) 0.9 % injection 3 mL (3 mLs Intravenous Given 03/12/18 1919)  alum & mag hydroxide-simeth (MAALOX/MYLANTA) 200-200-20 MG/5ML suspension 30 mL (30 mLs Oral Given 03/12/18 1732)    And  lidocaine (XYLOCAINE) 2 % viscous mouth solution 15 mL (15 mLs Oral Given 03/12/18 1732)  morphine 4 MG/ML injection 4 mg (  4 mg Intravenous Given 03/12/18 1903)  ondansetron (ZOFRAN) injection 4 mg (4 mg Intravenous Given 03/12/18 1901)  ketorolac (TORADOL) 30 MG/ML injection 30 mg (30 mg Intravenous Given 03/12/18 1905)  HYDROmorphone (DILAUDID) injection 1 mg (1 mg Intravenous Given 03/12/18 1956)  iohexol (OMNIPAQUE) 300 MG/ML solution 75 mL (75 mLs Intravenous Contrast Given 03/12/18 2032)     Initial Impression / Assessment and Plan / ED Course  I have reviewed the triage vital signs and the nursing notes.  Pertinent labs & imaging results that were available during my care of the patient were reviewed by me and considered in my medical decision making (see chart for details).  Pt's work up negative.  She was still c/o pain, so I ordered a CT which was nl.  Pt will be d/c home with prednisone and lortab.  Return if worse.  F/u with pcp.  Final Clinical Impressions(s) / ED Diagnoses   Final diagnoses:  Atypical chest pain    ED Discharge Orders         Ordered    HYDROcodone-acetaminophen (NORCO/VICODIN) 5-325 MG tablet  Every 4 hours PRN     03/12/18 2135    predniSONE (STERAPRED UNI-PAK 21 TAB) 10 MG (21) TBPK tablet     03/12/18 2135           Jacalyn LefevreHaviland, Doristine Shehan, MD 03/12/18 2137

## 2018-12-02 ENCOUNTER — Other Ambulatory Visit: Payer: Self-pay | Admitting: Obstetrics & Gynecology

## 2018-12-15 ENCOUNTER — Other Ambulatory Visit: Payer: Self-pay

## 2018-12-15 ENCOUNTER — Emergency Department
Admission: EM | Admit: 2018-12-15 | Discharge: 2018-12-15 | Disposition: A | Discharge status: Home / Self Care | Payer: 59 | Attending: Emergency Medicine | Admitting: Emergency Medicine

## 2018-12-15 ENCOUNTER — Emergency Department: Payer: 59

## 2018-12-15 ENCOUNTER — Encounter: Payer: Self-pay | Admitting: Emergency Medicine

## 2018-12-15 DIAGNOSIS — Z79899 Other long term (current) drug therapy: Secondary | ICD-10-CM | POA: Insufficient documentation

## 2018-12-15 DIAGNOSIS — M436 Torticollis: Secondary | ICD-10-CM | POA: Insufficient documentation

## 2018-12-15 DIAGNOSIS — Z7982 Long term (current) use of aspirin: Secondary | ICD-10-CM | POA: Diagnosis not present

## 2018-12-15 DIAGNOSIS — M25512 Pain in left shoulder: Secondary | ICD-10-CM | POA: Diagnosis not present

## 2018-12-15 LAB — CBC
HCT: 38.1 % (ref 36.0–46.0)
Hemoglobin: 12.8 g/dL (ref 12.0–15.0)
MCH: 28.4 pg (ref 26.0–34.0)
MCHC: 33.6 g/dL (ref 30.0–36.0)
MCV: 84.7 fL (ref 80.0–100.0)
Platelets: 279 10*3/uL (ref 150–400)
RBC: 4.5 MIL/uL (ref 3.87–5.11)
RDW: 13 % (ref 11.5–15.5)
WBC: 9.6 10*3/uL (ref 4.0–10.5)
nRBC: 0 % (ref 0.0–0.2)

## 2018-12-15 LAB — BASIC METABOLIC PANEL
Anion gap: 8 (ref 5–15)
BUN: 16 mg/dL (ref 6–20)
CO2: 22 mmol/L (ref 22–32)
Calcium: 9.5 mg/dL (ref 8.9–10.3)
Chloride: 107 mmol/L (ref 98–111)
Creatinine, Ser: 0.71 mg/dL (ref 0.44–1.00)
GFR calc Af Amer: >60 mL/min (ref >60)
GFR calc non Af Amer: >60 mL/min (ref >60)
Glucose, Bld: 106 mg/dL — ABNORMAL HIGH (ref 70–99)
Potassium: 4 mmol/L (ref 3.5–5.1)
Sodium: 137 mmol/L (ref 135–145)

## 2018-12-15 LAB — TROPONIN I (HIGH SENSITIVITY): Troponin I (High Sensitivity): 3 ng/L (ref <18)

## 2018-12-15 MED ORDER — CYCLOBENZAPRINE HCL 10 MG PO TABS: 10.0000 mg | ORAL_TABLET | Freq: Three times a day (TID) | ORAL | 0 refills | Status: DC | PRN

## 2018-12-15 NOTE — ED Notes (Signed)
See triage note  Presents with pain to left side of neck and into left arm  States sx's started after having a D&C done

## 2018-12-15 NOTE — ED Provider Notes (Signed)
[facility] Emergency Department Provider Note    First MD Initiated Contact with Patient 12/15/18 1202     (approximate)  I have reviewed the triage vital signs and the nursing notes.   HISTORY  Chief Complaint   HPI Christy Le is a 48 y.o. female presents to the emergency department secondary to left-sided neck pain radiating down to the left arm.  Patient states that she had one episode upon awakening last week and then subsequently another episode which began this morning at 5:30 and has since resolved.  Patient denies any chest pain no shortness of breath.  Patient states that symptoms began after receiving general anesthesia secondary to a D&C that was performed a week and a half ago.  Patient denies any weakness no numbness no gait instability or visual changes.      Past Medical History:  Diagnosis Date  . Arthritis   . Depression   . GERD (gastroesophageal reflux disease)     There are no active problems to display for this patient.   Past Surgical History:  Procedure Laterality Date  . CHOLECYSTECTOMY    . TUBAL LIGATION      Prior to Admission medications   Medication Sig Start Date End Date Taking? Authorizing Provider  aspirin 325 MG tablet Take 325 mg by mouth daily as needed (chest pain).     [provider]  cyclobenzaprine (FLEXERIL) 10 MG tablet Take 1 tablet (10 mg total) by mouth 3 (three) times daily as needed. 12/15/18   Gregor Hams, MD  Omega 3-6-9 Fatty Acids (TRIPLE OMEGA-3-6-9 PO) Take 1 tablet by mouth daily.    [provider]  VITAMIN D PO Take 1 tablet by mouth daily.    [provider]    Allergies Other  Family History  Problem Relation Age of Onset  . Hypertension Mother   . Mental retardation Mother   . Heart disease Father   . Congestive Heart Failure Father   . Cancer Maternal Grandfather        Lung  . Hypertension Maternal Grandfather   . Heart disease Paternal  Grandfather   . Hypertension Paternal Grandfather     Social History Social History   Tobacco Use  . Smoking status: Never Smoker  . Smokeless tobacco: Never Used  Substance Use Topics  . Alcohol use: Yes    Comment: occasional  . Drug use: No    Review of Systems Constitutional: No fever/chills Eyes: No visual changes. ENT: No sore throat.  Positive for left-sided neck pain Cardiovascular: Denies chest pain. Respiratory: Denies shortness of breath. Gastrointestinal: No abdominal pain.  No nausea, no vomiting.  No diarrhea.  No constipation. Genitourinary: Negative for dysuria. Musculoskeletal: Negative for neck pain.  Negative for back pain. Integumentary: Negative for rash. Neurological: Negative for headaches, focal weakness or numbness.   ____________________________________________   PHYSICAL EXAM:  VITAL SIGNS: ED Triage Vitals  Enc Vitals Group     BP 12/15/18 1008 131/78     Pulse Rate 12/15/18 1008 77     Resp 12/15/18 1008 20     Temp 12/15/18 1013 99.3 F (37.4 C)     Temp Source 12/15/18 1013 Oral     SpO2 12/15/18 1008 99 %     Weight 12/15/18 1009 100.2 kg (221 lb)     Height 12/15/18 1009 1.753 m (5\' 9" )     Head Circumference --      Peak Flow --  Pain Score 12/15/18 1009 0     Pain Loc --      Pain Edu? --      Excl. in GC? --     Constitutional: Alert and oriented.  Eyes: Conjunctivae are normal.  Head: Atraumatic. Nose: No congestion/rhinnorhea. Mouth/Throat: Patient is wearing a mask. Neck: No stridor.  Pain with palpation of the left trapezius/cleidomastoid muscle Cardiovascular: Normal rate, regular rhythm. Good peripheral circulation. Grossly normal heart sounds. Respiratory: Normal respiratory effort.  No retractions. Gastrointestinal: Soft and nontender. No distention.  Musculoskeletal: No lower extremity tenderness nor edema. No gross deformities of extremities. Neurologic:  Normal speech and language. No gross focal  neurologic deficits are appreciated.  Skin:  Skin is warm, dry and intact. Psychiatric: Mood and affect are normal. Speech and behavior are normal. ____________________________________________   LABS (all labs ordered are listed, but only abnormal results are displayed)  Labs Reviewed  BASIC METABOLIC PANEL - Abnormal; Notable for the following components:      Result Value   Glucose, Bld 106 (*)    All other components within normal limits  CBC  TROPONIN I (HIGH SENSITIVITY)  TROPONIN I (HIGH SENSITIVITY)   ____________________________________________  EKG  ED ECG REPORT I, Roseto N , the attending physician, personally viewed and interpreted this ECG.   Date: 12/15/2018  EKG Time: 10:16 AM  Rate: 67  Rhythm: Normal sinus rhythm  Axis: Normal  Intervals: Normal  ST&T Change: Normal _______________________________  RADIOLOGY I, Prospect N , personally viewed and evaluated these images (plain radiographs) as part of my medical decision making, as well as reviewing the written report by the radiologist.  ED MD interpretation: No active cardiopulmonary disease noted on chest x-ray per radiologist.  Official radiology report(s): Dg Chest 2 View  Result Date: 12/15/2018 CLINICAL DATA:  Chest pain. EXAM: CHEST - 2 VIEW COMPARISON:  March 12, 2018.  February 16, 2011. FINDINGS: The heart size and mediastinal contours are within normal limits. Both lungs are clear. No pneumothorax or pleural effusion is noted. The visualized skeletal structures are unremarkable. IMPRESSION: No active cardiopulmonary disease. Electronically Signed   By: Lupita Raider M.D.   On: 12/15/2018 10:53     Procedures   ____________________________________________   INITIAL IMPRESSION / MDM / ASSESSMENT AND PLAN / ED COURSE  As part of my medical decision making, I reviewed the following data within the electronic MEDICAL RECORD NUMBER  48 year old female presented with above-stated  history and physical exam secondary to left-sided neck pain without any focal neurological deficits.  History physical exam consistent with torticollis.  I advised the patient to have an MRI done outpatient if symptoms were to recur.      ____________________________________________  FINAL CLINICAL IMPRESSION(S) / ED DIAGNOSES  Final diagnoses:  Torticollis, acute     MEDICATIONS GIVEN DURING THIS VISIT:  Medications - No data to display   ED Discharge Orders         Ordered    cyclobenzaprine (FLEXERIL) 10 MG tablet  3 times daily PRN     12/15/18 1222          *Please note:  Keiera A Grabe was evaluated in Emergency Department on 12/15/2018 for the symptoms described in the history of present illness. She was evaluated in the context of the global COVID-19 pandemic, which necessitated consideration that the patient might be at risk for infection with the SARS-CoV-2 virus that causes COVID-19. Institutional protocols and algorithms that pertain to the evaluation of patients  at risk for COVID-19 are in a state of rapid change based on information released by regulatory bodies including the CDC and federal and state organizations. These policies and algorithms were followed during the patient's care in the ED.  Some ED evaluations and interventions may be delayed as a result of limited staffing during the pandemic.*  Note:  This document was prepared using Dragon voice recognition software and may include unintentional dictation errors.   Darci CurrentBrown, Sultana N, MD 12/15/18 1226

## 2018-12-15 NOTE — ED Triage Notes (Signed)
Pt reports ha a D & C last week and had an interaction with the anesthesia. Pt reports shortly after had an episode of pain to her left neck, shoulder and arm but it subsided. Pt reports this AM around 5 she started with the same symptoms, pain to the left side of her neck, left shoulder and and left arm. Denies SOB.

## 2019-02-22 IMAGING — CT CT CHEST W/ CM
2 of 3 series · 15 of 36 positions shown, 18 images · IV contrast (omnipaque)
Comparison: 03/12/2018 chest radiograph

CLINICAL DATA: Right-sided chest pain under the right breast
radiating into the back.

EXAM:
CT CHEST WITH CONTRAST
TECHNIQUE: Multidetector CT imaging of the chest was performed during
intravenous contrast administration.
CONTRAST:  75mL OMNIPAQUE IOHEXOL 300 MG/ML  SOLN

[Series 2: axial st · axial · 0.71mm/px · z∈[-101,+171]mm · 12 of 160 slices shown, 15 images]
[im 12/160  mediastinal]
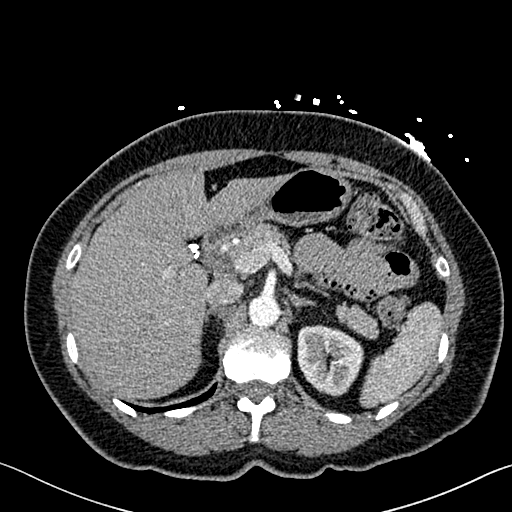
[im 12/160  lung]
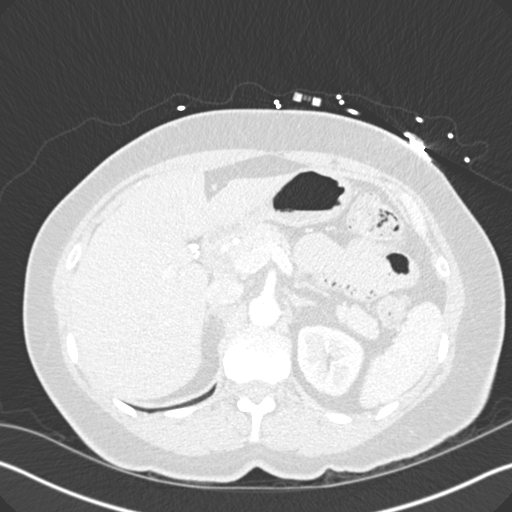
[im 24/160  lung]
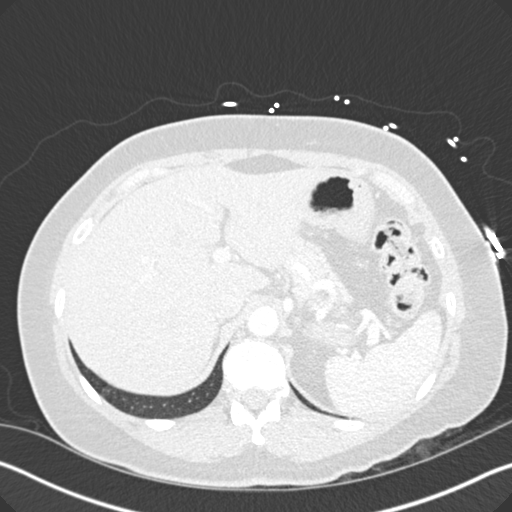
[im 36/160  lung]
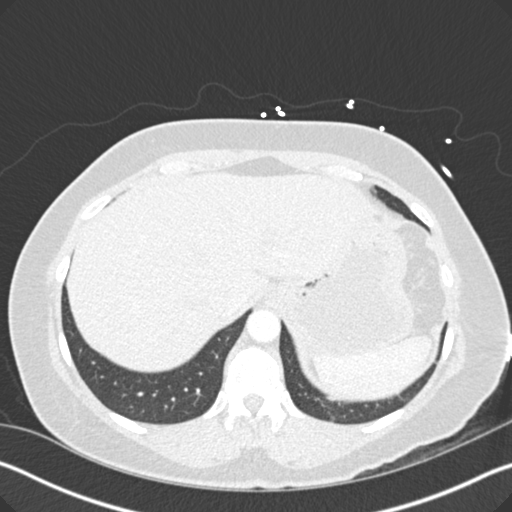
[im 48/160  lung]
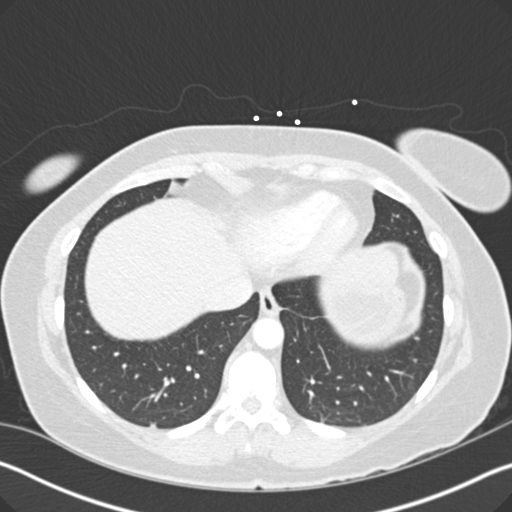
[im 59/160  mediastinal]
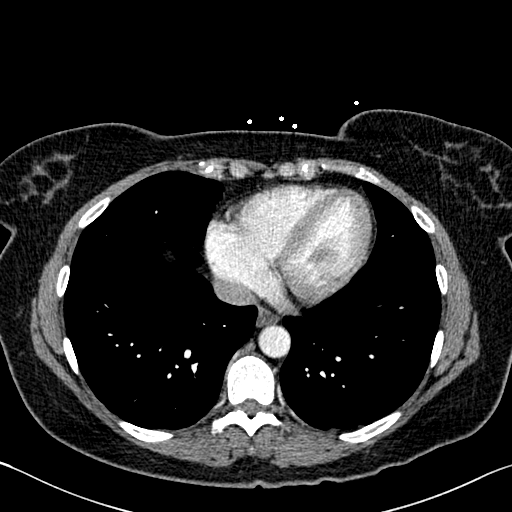
[im 59/160  lung]
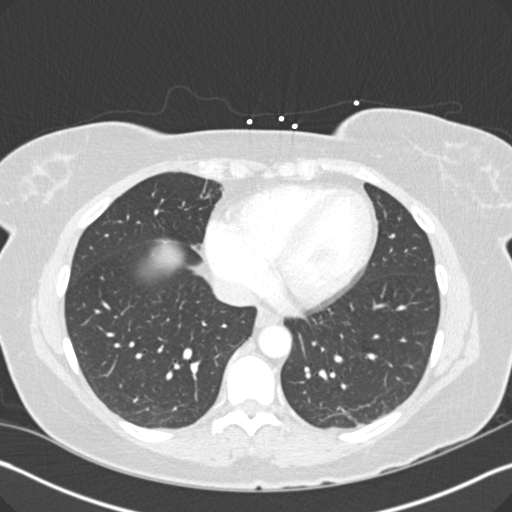
[im 71/160  lung]
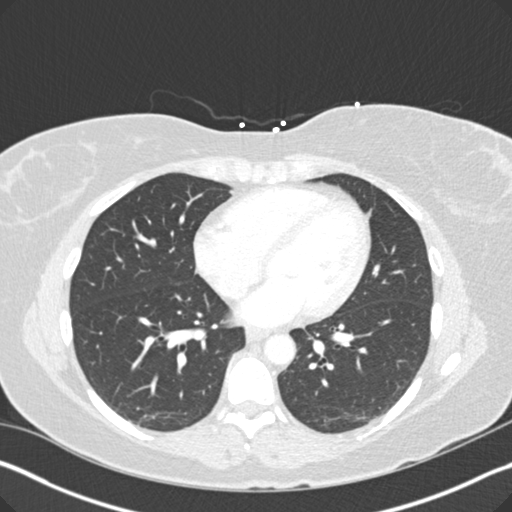
[im 89/160  lung]
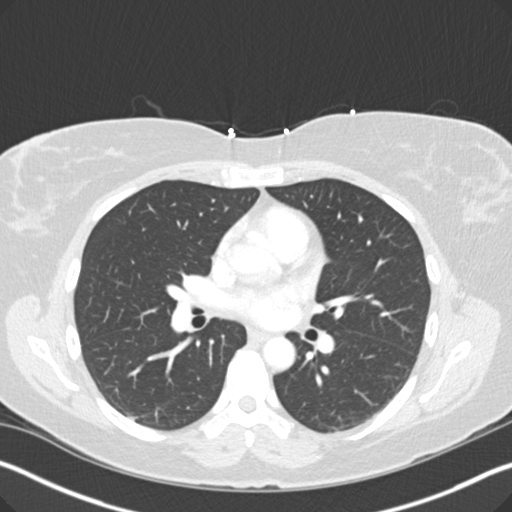
[im 101/160  lung]
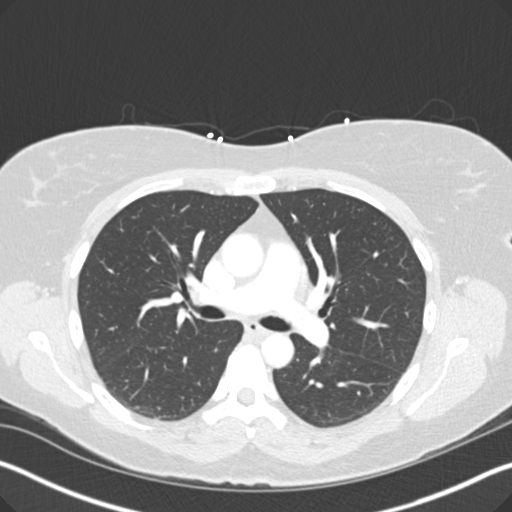
[im 112/160  mediastinal]
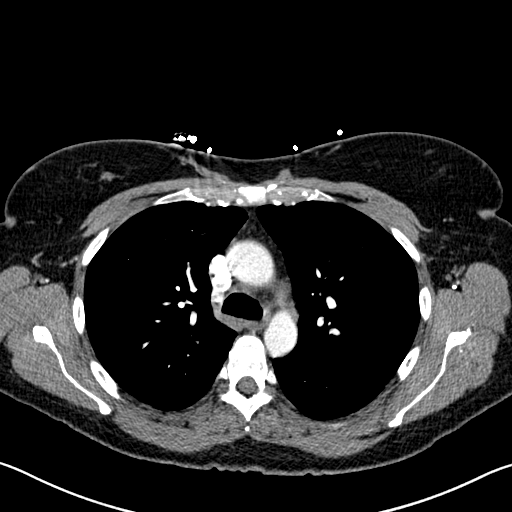
[im 112/160  lung]
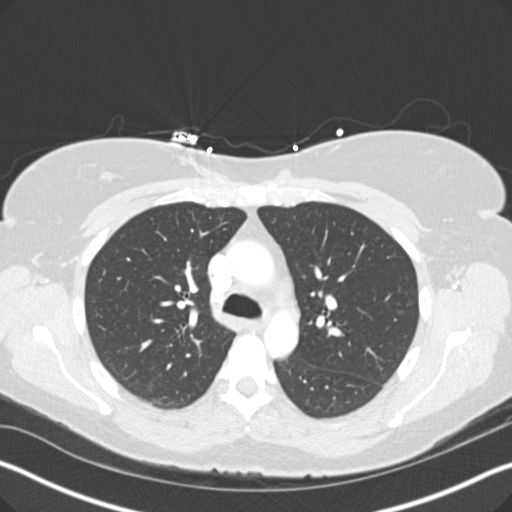
[im 124/160  lung]
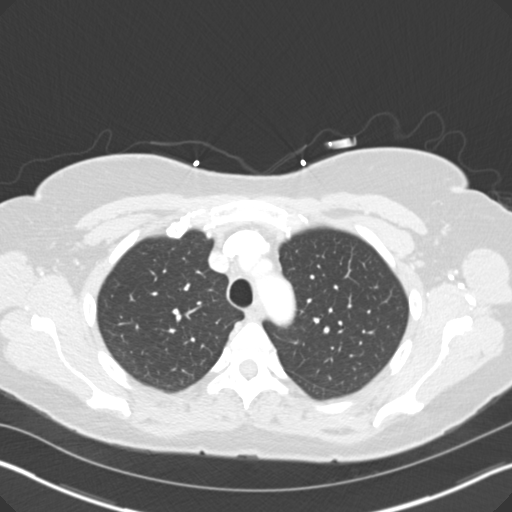
[im 136/160  lung]
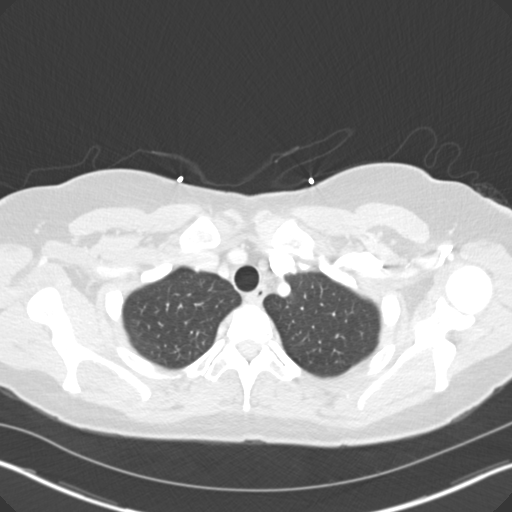
[im 148/160  lung]
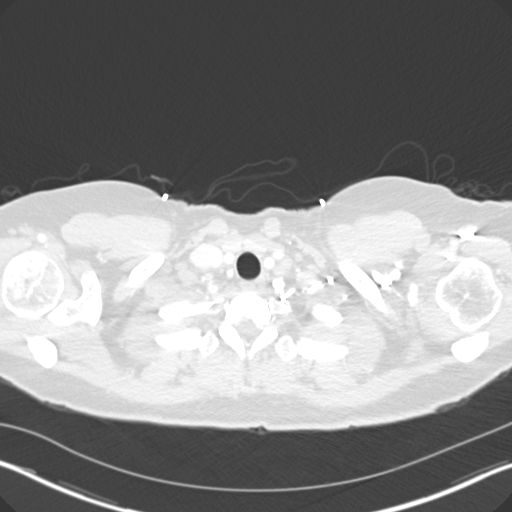

[Series 6: coronal · coronal · 0.59mm/px · 3 of 134 slices shown]
[im 27/134  lung]
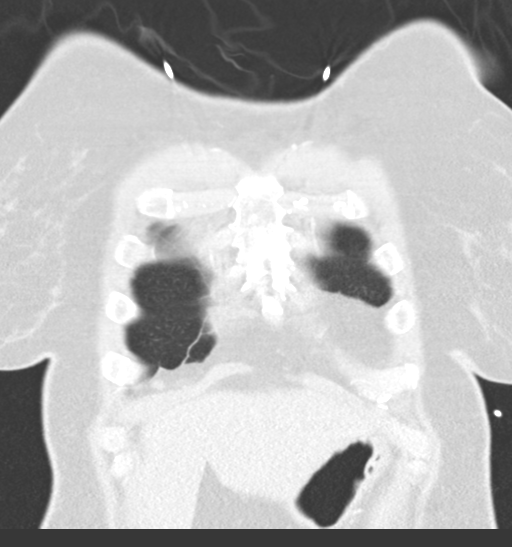
[im 54/134  lung]
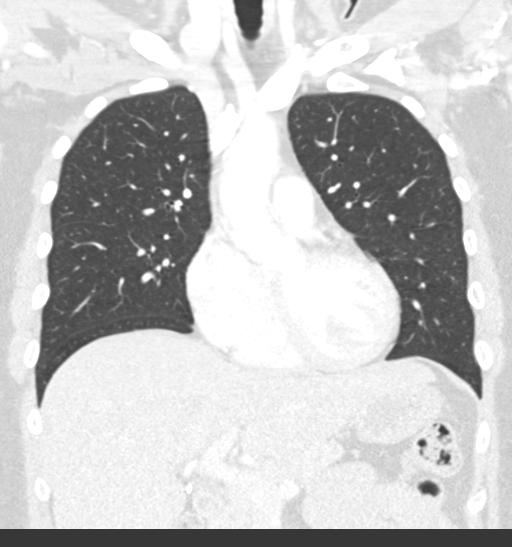
[im 80/134  lung]
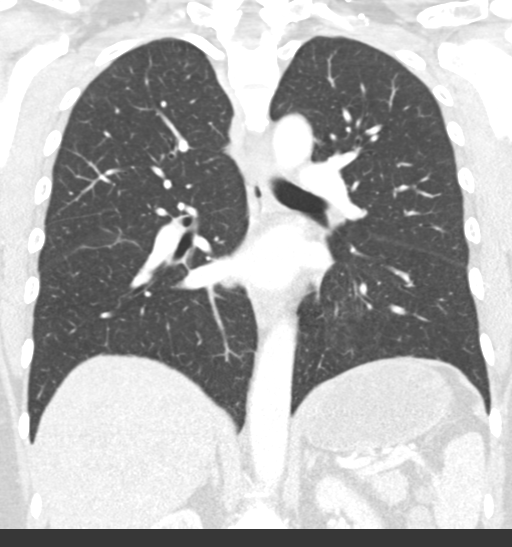

[15 of 36 positions shown; findings below may reference images not displayed]

FINDINGS: Cardiovascular: No significant vascular findings. Normal heart size.
No pericardial effusion.

Mediastinum/Nodes: No enlarged mediastinal, hilar, or axillary lymph
nodes. Thyroid gland, trachea, and esophagus demonstrate no
significant findings.

Lungs/Pleura: Lungs are clear. No pleural effusion or pneumothorax.

Upper Abdomen: Cholecystectomy.

Musculoskeletal: No chest wall abnormality. No acute or significant
osseous findings.
IMPRESSION: No acute process identified.  Negative CT of the chest.

## 2019-05-01 ENCOUNTER — Ambulatory Visit: Payer: 59 | Attending: Internal Medicine

## 2019-05-01 DIAGNOSIS — Z20822 Contact with and (suspected) exposure to covid-19: Secondary | ICD-10-CM

## 2019-05-02 LAB — SARS-COV-2, NAA 2 DAY TAT

## 2019-05-02 LAB — NOVEL CORONAVIRUS, NAA: SARS-CoV-2, NAA: NOT DETECTED

## 2019-05-07 ENCOUNTER — Ambulatory Visit (INDEPENDENT_AMBULATORY_CARE_PROVIDER_SITE_OTHER): Payer: 59 | Admitting: Dermatology

## 2019-05-07 ENCOUNTER — Other Ambulatory Visit: Payer: Self-pay

## 2019-05-07 DIAGNOSIS — L578 Other skin changes due to chronic exposure to nonionizing radiation: Secondary | ICD-10-CM

## 2019-05-07 DIAGNOSIS — L720 Epidermal cyst: Secondary | ICD-10-CM | POA: Diagnosis not present

## 2019-05-07 NOTE — Patient Instructions (Addendum)
Recommend daily broad spectrum sunscreen SPF 30+ to sun-exposed areas, reapply every 2 hours as needed. Call for new or changing lesions.  Recommend daily Heliocare during summer months (For beach trips or days when you'll be outdoors for hours, can take 2 regular heliocare capsules or use the heliocare ultra)  Recommend Serica moisturizing scar formula for scars

## 2019-05-07 NOTE — Progress Notes (Signed)
   Follow-Up Visit   Subjective  Christy Le is a 49 y.o. female who presents for the following: Mass (center chest for past 6 to 8 weeks, will not go away, bothersome but not inflamed.).  The following portions of the chart were reviewed this encounter and updated as appropriate: Tobacco  Allergies  Meds  Problems  Med Hx  Surg Hx  Fam Hx      Review of Systems: No other skin or systemic complaints.  Objective  Well appearing patient in no apparent distress; mood and affect are within normal limits.  All skin waist up examined.  Objective  Chest - Medial [facility]): 0.9 cm subcutaneous nodule mid chest  Assessment & Plan    Actinic Damage - diffuse scaly erythematous macules with underlying dyspigmentation - Recommend daily broad spectrum sunscreen SPF 30+ to sun-exposed areas, reapply every 2 hours as needed.  - Call for new or changing lesions.  Epidermal inclusion cyst Chest - Medial (Center)  Benign-appearing.  Observation.  Call clinic for new or changing spots.   Advised that the only option to remove cysts is with a surgery that removes the sac.  Return for TBSE in fall , return for new or changing spots.     ILeward Quan, CMA, am acting as scribe for Darden Dates, MD .  Documentation: I have reviewed the above documentation for accuracy and completeness, and I agree with the above.  Darden Dates, MD

## 2019-05-19 ENCOUNTER — Encounter: Payer: Self-pay | Admitting: Dermatology

## 2019-09-02 ENCOUNTER — Other Ambulatory Visit: Payer: Self-pay | Admitting: Obstetrics and Gynecology

## 2019-10-07 ENCOUNTER — Other Ambulatory Visit: Payer: Self-pay | Admitting: Critical Care Medicine

## 2019-10-07 ENCOUNTER — Other Ambulatory Visit: Payer: 59

## 2019-10-07 DIAGNOSIS — Z20822 Contact with and (suspected) exposure to covid-19: Secondary | ICD-10-CM

## 2019-10-09 LAB — NOVEL CORONAVIRUS, NAA: SARS-CoV-2, NAA: DETECTED — AB

## 2019-11-05 ENCOUNTER — Encounter: Payer: 59 | Admitting: Dermatology

## 2019-11-18 ENCOUNTER — Encounter: Payer: 59 | Admitting: Dermatology

## 2019-11-27 IMAGING — CR DG CHEST 2V
2 series · 2 of 2 positions shown · non-contrast
Comparison: [DATE] [DATE], [DATE].  [DATE] [DATE], [DATE].

CLINICAL DATA: Chest pain.

EXAM:
CHEST - 2 VIEW

[chest pa]
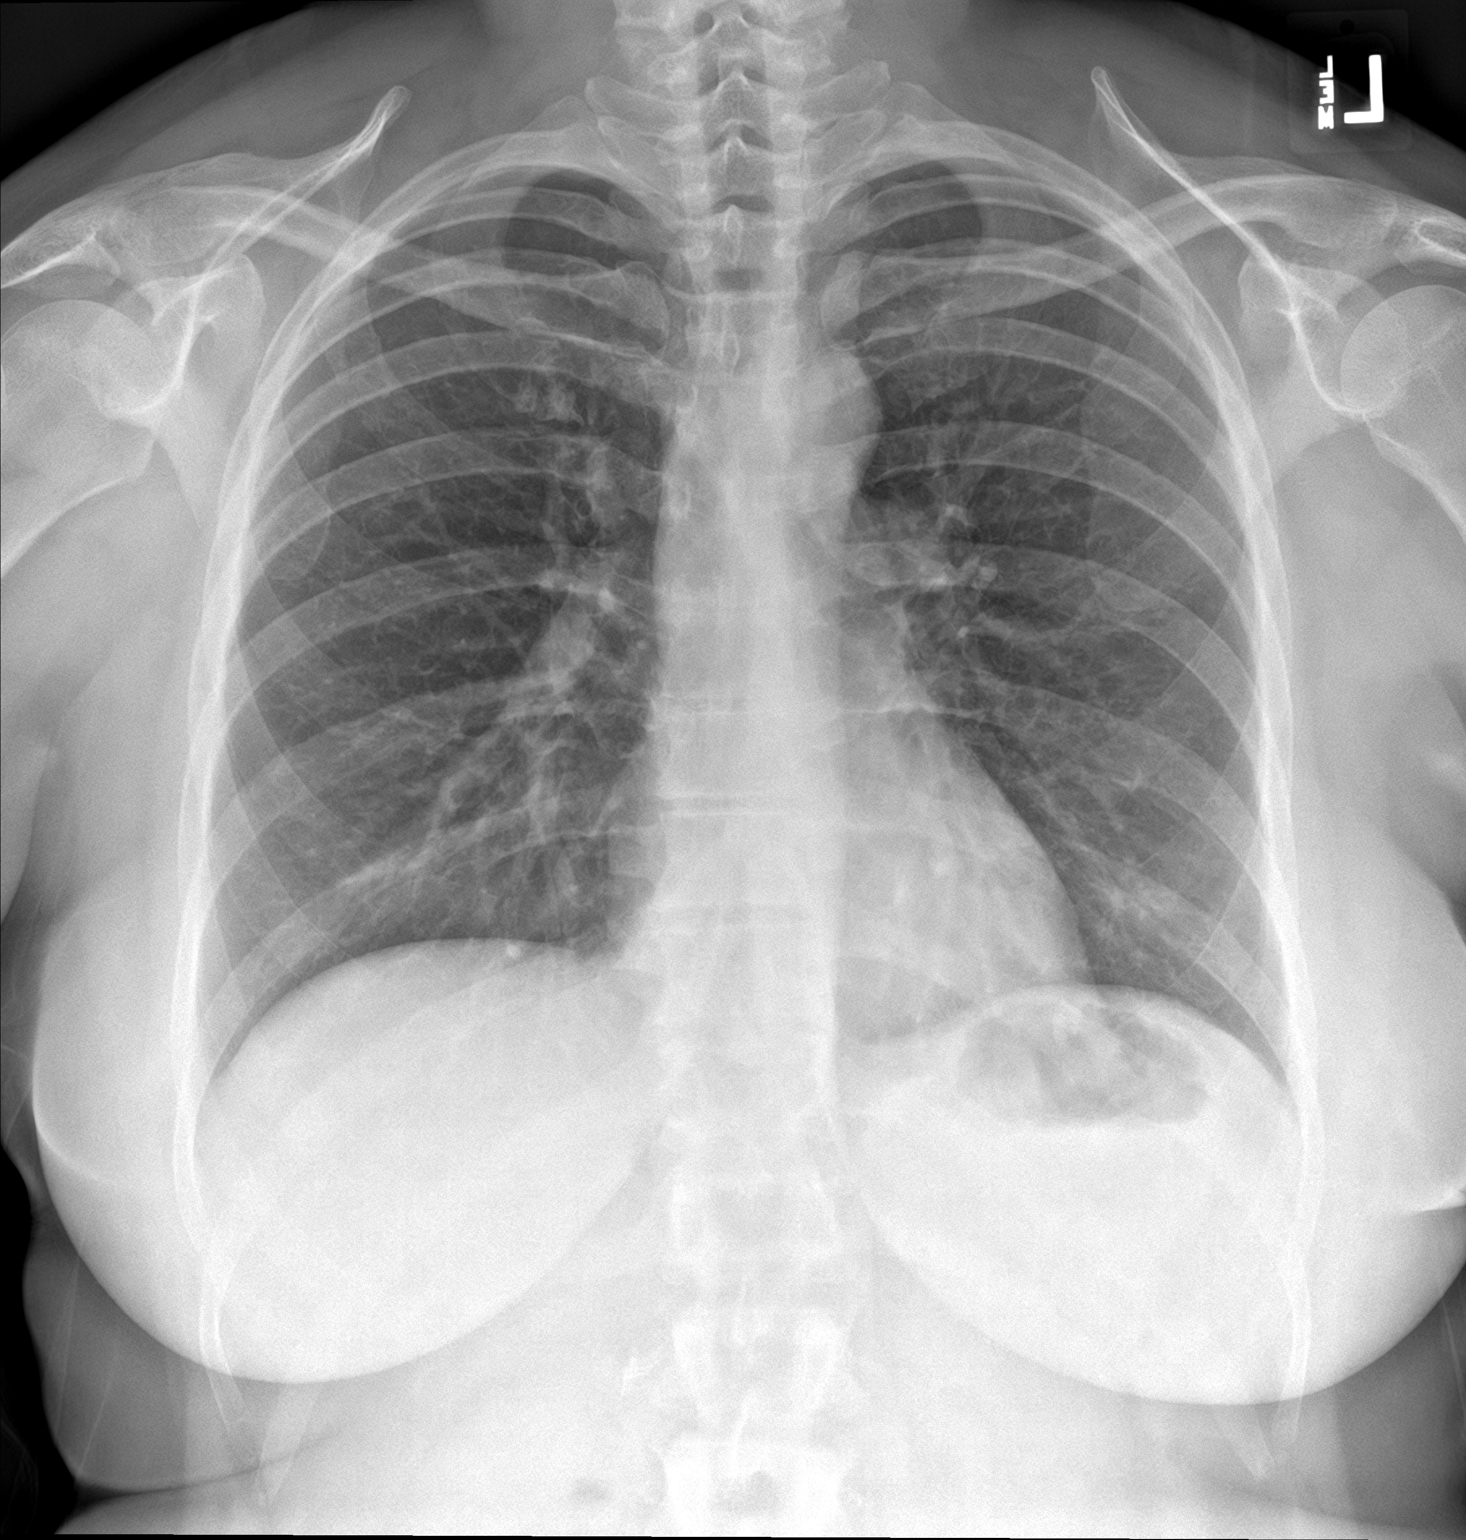

[chest lat]
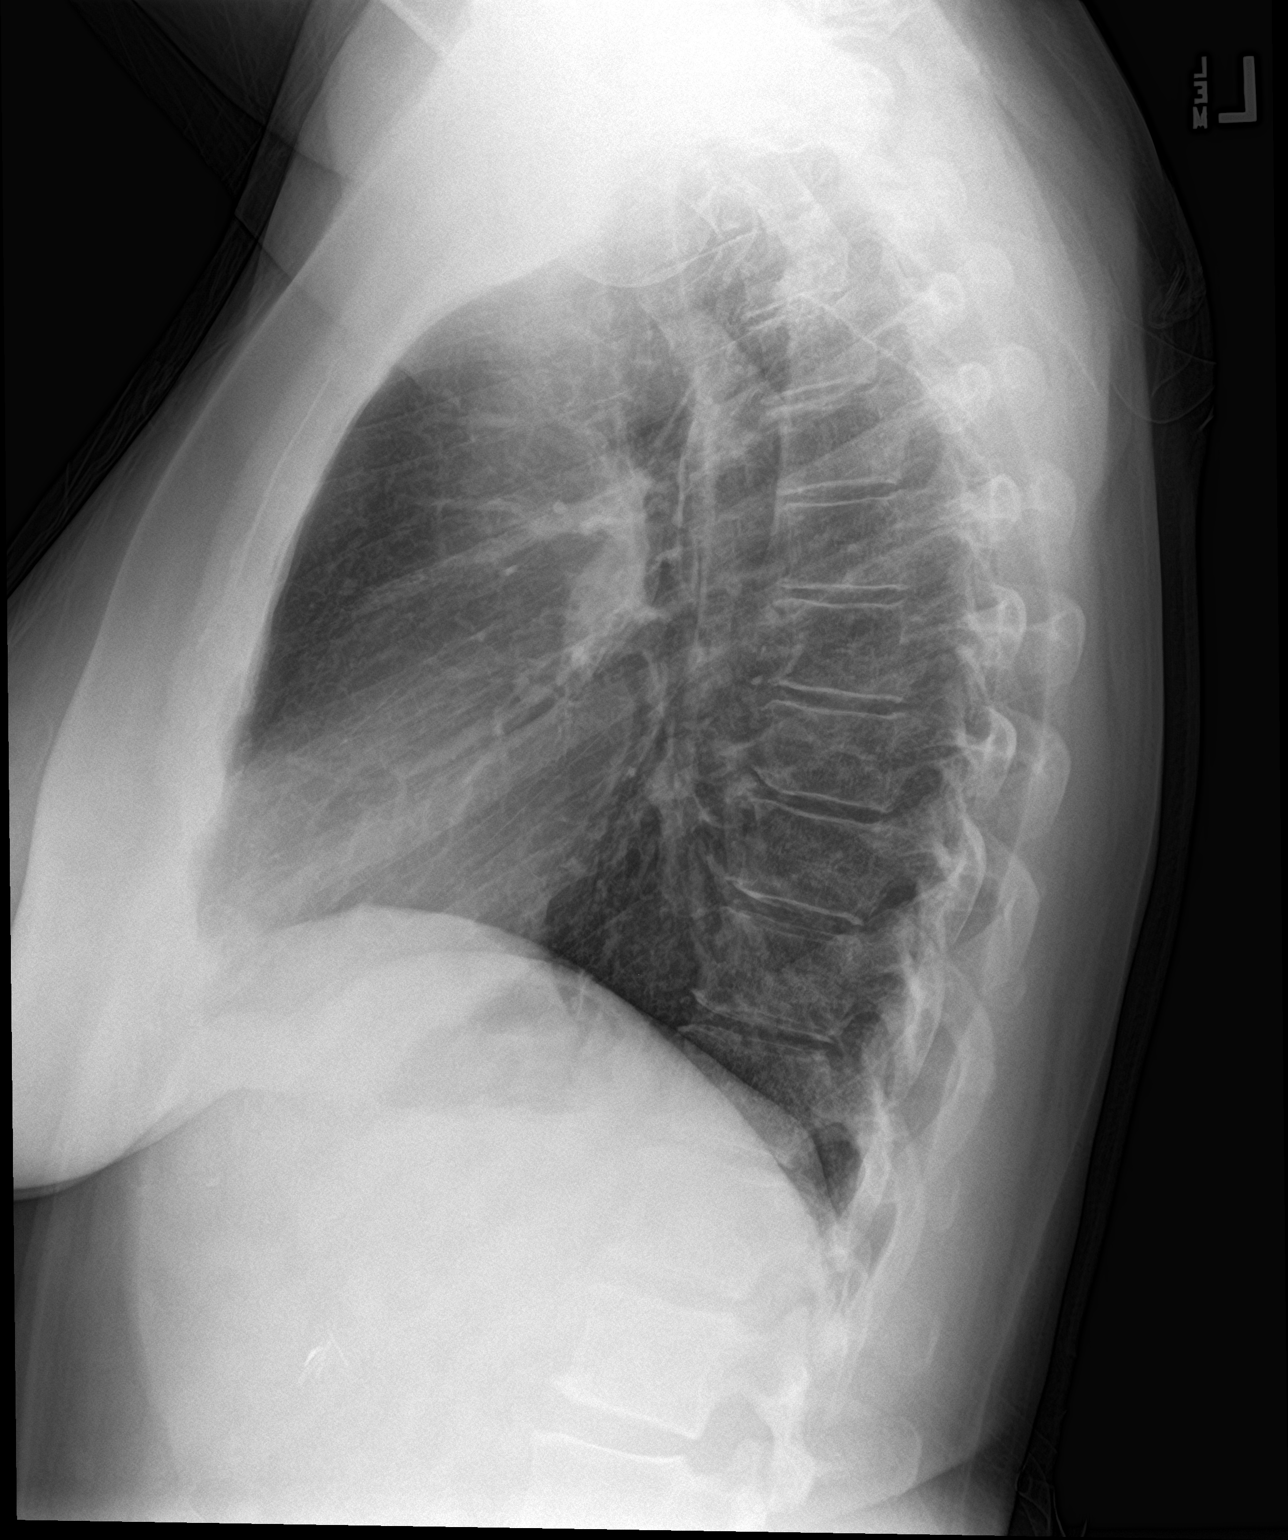

[2 of 2 positions shown; findings below may reference images not displayed]

FINDINGS: The heart size and mediastinal contours are within normal limits.
Both lungs are clear. No pneumothorax or pleural effusion is noted.
The visualized skeletal structures are unremarkable.
IMPRESSION: No active cardiopulmonary disease.

## 2020-01-22 ENCOUNTER — Encounter (INDEPENDENT_AMBULATORY_CARE_PROVIDER_SITE_OTHER): Payer: Self-pay | Admitting: Family Medicine

## 2020-01-25 NOTE — Telephone Encounter (Signed)
Please advise 

## 2020-02-09 ENCOUNTER — Ambulatory Visit (INDEPENDENT_AMBULATORY_CARE_PROVIDER_SITE_OTHER): Payer: Self-pay | Admitting: Family Medicine

## 2020-02-23 ENCOUNTER — Ambulatory Visit (INDEPENDENT_AMBULATORY_CARE_PROVIDER_SITE_OTHER): Payer: Self-pay | Admitting: Family Medicine

## 2020-05-17 DIAGNOSIS — M25552 Pain in left hip: Secondary | ICD-10-CM | POA: Insufficient documentation

## 2020-06-06 ENCOUNTER — Encounter (INDEPENDENT_AMBULATORY_CARE_PROVIDER_SITE_OTHER): Payer: Self-pay | Admitting: Bariatrics

## 2020-06-07 ENCOUNTER — Ambulatory Visit (INDEPENDENT_AMBULATORY_CARE_PROVIDER_SITE_OTHER): Payer: 59 | Admitting: Bariatrics

## 2020-06-07 ENCOUNTER — Encounter (INDEPENDENT_AMBULATORY_CARE_PROVIDER_SITE_OTHER): Payer: Self-pay | Admitting: Bariatrics

## 2020-06-07 ENCOUNTER — Other Ambulatory Visit: Payer: Self-pay

## 2020-06-07 VITALS — BP 106/61 | HR 69 | Temp 98.1°F | Ht 69.0 in | Wt 224.0 lb

## 2020-06-07 DIAGNOSIS — R5383 Other fatigue: Secondary | ICD-10-CM

## 2020-06-07 DIAGNOSIS — M25552 Pain in left hip: Secondary | ICD-10-CM

## 2020-06-07 DIAGNOSIS — E559 Vitamin D deficiency, unspecified: Secondary | ICD-10-CM

## 2020-06-07 DIAGNOSIS — E538 Deficiency of other specified B group vitamins: Secondary | ICD-10-CM

## 2020-06-07 DIAGNOSIS — E6609 Other obesity due to excess calories: Secondary | ICD-10-CM

## 2020-06-07 DIAGNOSIS — Z9189 Other specified personal risk factors, not elsewhere classified: Secondary | ICD-10-CM

## 2020-06-07 DIAGNOSIS — Z6833 Body mass index (BMI) 33.0-33.9, adult: Secondary | ICD-10-CM

## 2020-06-07 DIAGNOSIS — Z1331 Encounter for screening for depression: Secondary | ICD-10-CM

## 2020-06-07 DIAGNOSIS — R0602 Shortness of breath: Secondary | ICD-10-CM | POA: Diagnosis not present

## 2020-06-07 DIAGNOSIS — E669 Obesity, unspecified: Secondary | ICD-10-CM

## 2020-06-07 DIAGNOSIS — R7303 Prediabetes: Secondary | ICD-10-CM

## 2020-06-07 DIAGNOSIS — Z0289 Encounter for other administrative examinations: Secondary | ICD-10-CM

## 2020-06-08 ENCOUNTER — Encounter (INDEPENDENT_AMBULATORY_CARE_PROVIDER_SITE_OTHER): Payer: Self-pay | Admitting: Bariatrics

## 2020-06-08 DIAGNOSIS — R7303 Prediabetes: Secondary | ICD-10-CM | POA: Insufficient documentation

## 2020-06-08 LAB — COMPREHENSIVE METABOLIC PANEL
ALT: 33 IU/L — ABNORMAL HIGH (ref 0–32)
AST: 25 IU/L (ref 0–40)
Albumin/Globulin Ratio: 1.9 (ref 1.2–2.2)
Albumin: 4.6 g/dL (ref 3.8–4.9)
Alkaline Phosphatase: 64 IU/L (ref 44–121)
BUN/Creatinine Ratio: 19 (ref 9–23)
BUN: 16 mg/dL (ref 6–24)
Bilirubin Total: 0.4 mg/dL (ref 0.0–1.2)
CO2: 22 mmol/L (ref 20–29)
Calcium: 9.3 mg/dL (ref 8.7–10.2)
Chloride: 100 mmol/L (ref 96–106)
Creatinine, Ser: 0.85 mg/dL (ref 0.57–1.00)
Globulin, Total: 2.4 g/dL (ref 1.5–4.5)
Glucose: 103 mg/dL — ABNORMAL HIGH (ref 65–99)
Potassium: 4.1 mmol/L (ref 3.5–5.2)
Sodium: 138 mmol/L (ref 134–144)
Total Protein: 7 g/dL (ref 6.0–8.5)
eGFR: 83 mL/min/{1.73_m2} (ref >59)

## 2020-06-08 LAB — TSH: TSH: 2.51 u[IU]/mL (ref 0.450–4.500)

## 2020-06-08 LAB — HEMOGLOBIN A1C
Est. average glucose Bld gHb Est-mCnc: 117 mg/dL
Hgb A1c MFr Bld: 5.7 % — ABNORMAL HIGH (ref 4.8–5.6)

## 2020-06-08 LAB — VITAMIN D 25 HYDROXY (VIT D DEFICIENCY, FRACTURES): Vit D, 25-Hydroxy: 36.7 ng/mL (ref 30.0–100.0)

## 2020-06-08 LAB — T3: T3, Total: 109 ng/dL (ref 71–180)

## 2020-06-08 LAB — INSULIN, RANDOM: INSULIN: 6.5 u[IU]/mL (ref 2.6–24.9)

## 2020-06-08 LAB — VITAMIN B12: Vitamin B-12: 699 pg/mL (ref 232–1245)

## 2020-06-08 LAB — T4, FREE: Free T4: 0.76 ng/dL — ABNORMAL LOW (ref 0.82–1.77)

## 2020-06-08 NOTE — Progress Notes (Signed)
Chief Complaint:   OBESITY Christy Le (MR# [id number]) is a 50 y.o. female who presents for evaluation and treatment of obesity and related comorbidities. Current BMI is Body mass index is 33.08 kg/m. Christy Le has been struggling with her weight for many years and has been unsuccessful in either losing weight, maintaining weight loss, or reaching her healthy weight goal.  Christy Le does not necessarily like to cook due to preparation and time restrictions. She craves carbohydrates.  Christy Le is currently in the action stage of change and ready to dedicate time achieving and maintaining a healthier weight. Christy Le is interested in becoming our patient and working on intensive lifestyle modifications including (but not limited to) diet and exercise for weight loss.  Christy Le habits were reviewed today and are as follows: Her family eats meals together, she thinks her family will eat healthier with her, her desired weight loss is 49 lbs, she started gaining weight in 2020, her heaviest weight ever was 228 pounds, she has significant food cravings issues, she snacks frequently in the evenings, she frequently makes poor food choices, she frequently eats larger portions than normal and she struggles with emotional eating.  Depression Screen Christy Le Food and Mood (modified PHQ-9) score was 14.  Depression screen PHQ 2/9 06/07/2020  Decreased Interest 3  Down, Depressed, Hopeless 2  PHQ - 2 Score 5  Altered sleeping 2  Tired, decreased energy 3  Change in appetite 1  Feeling bad or failure about yourself  2  Trouble concentrating 1  Moving slowly or fidgety/restless 0  Suicidal thoughts 0  PHQ-9 Score 14  Difficult doing work/chores Somewhat difficult   Subjective:   1. Other fatigue Christy Le admits to daytime somnolence and admits to waking up still tired. Christy Le has a history of symptoms of daytime fatigue. Christy Le generally gets 6 or 7 hours of sleep per night, and states that  she has difficulty falling asleep. Snoring is present. Apneic episodes are present. Epworth Sleepiness Score is 5.  2. SOB (shortness of breath) on exertion Christy Le notes increasing shortness of breath with exercising and seems to be worsening over time with weight gain. She notes getting out of breath sooner with activity than she used to. This has not gotten worse recently. IC was done today and RMR is 1944. Christy Le denies shortness of breath at rest or orthopnea.  3. Pre-diabetes Christy Le is not on medications currently.  4. Vitamin D deficiency Christy Le is taking OTC Vit D.  5. Pain of left hip joint Christy Le sees Orthopedic for her hip pain.  6. Vitamin B 12 deficiency Christy Le is currently taking OTC B12.  7. At risk for activity intolerance Christy Le is at risk for exercise intolerance due to obesity, back and hip pain.  Assessment/Plan:   1. Other fatigue Christy Le does feel that her weight is causing her energy to be lower than it should be. Fatigue may be related to obesity, depression or many other causes. Labs will be ordered, and in the meanwhile, Christy Le will focus on self care including making healthy food choices, increasing physical activity and focusing on stress reduction.  - EKG 12-Lead - Vitamin B12 - Comprehensive metabolic panel - Hemoglobin A1c - Insulin, random - T3 - T4, free - TSH - VITAMIN D 25 Hydroxy (Vit-D Deficiency, Fractures)  2. SOB (shortness of breath) on exertion Christy Le does feel that she gets out of breath more easily that she used to when she exercises. Christy Le's shortness of breath appears to be  obesity related and exercise induced. She has agreed to work on weight loss and gradually increase exercise to treat her exercise induced shortness of breath. We discussed ways to increase her RMR. Will continue to monitor closely.  3. Pre-diabetes Christy Le will continue to work on weight loss, exercise, and decreasing simple carbohydrates to help  decrease the risk of diabetes. We will check labs today.  - Hemoglobin A1c - Insulin, random  4. Vitamin D deficiency Low Vitamin D level contributes to fatigue and are associated with obesity, breast, and colon cancer. We will check labs today. Christy Le will follow-up for routine testing of Vitamin D, at least 2-3 times per year to avoid over-replacement.  - VITAMIN D 25 Hydroxy (Vit-D Deficiency, Fractures)  5. Pain of left hip joint Christy Le will continue to see Orthopedic as directed.  6. Vitamin B 12 deficiency The diagnosis was reviewed with the patient. Counseling provided today, see below. We will check labs today, and will continue to monitor. Orders and follow up as documented in patient record.  Counseling . The body needs vitamin B12: to make red blood cells; to make DNA; and to help the nerves work properly so they can carry messages from the brain to the body.  . The main causes of vitamin B12 deficiency include dietary deficiency, digestive diseases, pernicious anemia, and having a surgery in which part of the stomach or small intestine is removed.  . Certain medicines can make it harder for the body to absorb vitamin B12. These medicines include: heartburn medications; some antibiotics; some medications used to treat diabetes, gout, and high cholesterol.  . In some cases, there are no symptoms of this condition. If the condition leads to anemia or nerve damage, various symptoms can occur, such as weakness or fatigue, shortness of breath, and numbness or tingling in your hands and feet.   . Treatment:  o May include taking vitamin B12 supplements.  o Avoid alcohol.  o Eat lots of healthy foods that contain vitamin B12: - Beef, pork, chicken, [place], and organ meats, such as liver.  - Seafood: This includes clams, rainbow trout, salmon, tuna, and haddock. Eggs.  - Cereal and dairy products that are fortified: This means that vitamin B12 has been added to the food.   - Vitamin  B12  7. Depression screen Christy Le had a positive depression screening. Depression is commonly associated with obesity and often results in emotional eating behaviors. We will monitor this closely and work on CBT to help improve the non-hunger eating patterns. Referral to Psychology may be required if no improvement is seen as she continues in our clinic.  8. At risk for activity intolerance Christy Le was given approximately 15 minutes of exercise intolerance counseling today. She is 50 y.o. female and has risk factors exercise intolerance including obesity. We discussed intensive lifestyle modifications today with an emphasis on specific weight loss instructions and strategies. Christy Le will slowly increase activity as tolerated.  Repetitive spaced learning was employed today to elicit superior memory formation and behavioral change.  9. Class 1 obesity with serious comorbidity and body mass index (BMI) of 33.0 to 33.9 in adult, unspecified obesity type Christy Le is currently in the action stage of change and her goal is to continue with weight loss efforts. I recommend Christy Le begin the structured treatment plan as follows:  She has agreed to the Category 3 Plan.  Intentional eating was discussed. Anet is to stop all sugary drinks.  Exercise goals: No exercise has been prescribed at  this time.   Behavioral modification strategies: increasing lean protein intake, decreasing simple carbohydrates, increasing vegetables, increasing water intake, decreasing liquid calories, decreasing eating out, no skipping meals, meal planning and cooking strategies, keeping healthy foods in the home and planning for success.  She was informed of the importance of frequent follow-up visits to maximize her success with intensive lifestyle modifications for her multiple health conditions. She was informed we would discuss her lab results at her next visit unless there is a critical issue that needs to be addressed  sooner. Christy Le agreed to keep her next visit at the agreed upon time to discuss these results.  Objective:   Blood pressure 106/61, pulse 69, temperature 98.1 F (36.7 C), height 5\' 9"  (1.753 m), weight 224 lb (101.6 kg), last menstrual period 11/17/2018, SpO2 97 %. Body mass index is 33.08 kg/m.  EKG: Normal sinus rhythm, rate 72 BPM.  Indirect Calorimeter completed today shows a VO2 of 279 and a REE of 1944.  Her calculated basal metabolic rate is 01/17/2019 thus her basal metabolic rate is better than expected.  General: Cooperative, alert, well developed, in no acute distress. HEENT: Conjunctivae and lids unremarkable. Cardiovascular: Regular rhythm.  Lungs: Normal work of breathing. Neurologic: No focal deficits.   Lab Results  Component Value Date   CREATININE 0.85 06/07/2020   BUN 16 06/07/2020   NA 138 06/07/2020   K 4.1 06/07/2020   CL 100 06/07/2020   CO2 22 06/07/2020   Lab Results  Component Value Date   ALT 33 (H) 06/07/2020   AST 25 06/07/2020   ALKPHOS 64 06/07/2020   BILITOT 0.4 06/07/2020   Lab Results  Component Value Date   HGBA1C 5.7 (H) 06/07/2020   HGBA1C 5.6 07/07/2013   Lab Results  Component Value Date   INSULIN 6.5 06/07/2020   Lab Results  Component Value Date   TSH 2.510 06/07/2020   Lab Results  Component Value Date   CHOL 154 07/07/2013   HDL 49.80 07/07/2013   LDLCALC 86 07/07/2013   TRIG 93.0 07/07/2013   CHOLHDL 3 07/07/2013   Lab Results  Component Value Date   WBC 9.6 12/15/2018   HGB 12.8 12/15/2018   HCT 38.1 12/15/2018   MCV 84.7 12/15/2018   PLT 279 12/15/2018   No results found for: IRON, TIBC, FERRITIN  Attestation Statements:   Reviewed by clinician on day of visit: allergies, medications, problem list, medical history, surgical history, family history, social history, and previous encounter notes.   13/10/2018, am acting as Trude Mcburney for Energy manager, DO.  I have reviewed the above documentation  for accuracy and completeness, and I agree with the above. [employer], DO

## 2020-06-13 ENCOUNTER — Encounter (INDEPENDENT_AMBULATORY_CARE_PROVIDER_SITE_OTHER): Payer: Self-pay | Admitting: Bariatrics

## 2020-06-13 DIAGNOSIS — E6609 Other obesity due to excess calories: Secondary | ICD-10-CM | POA: Insufficient documentation

## 2020-06-21 ENCOUNTER — Encounter (INDEPENDENT_AMBULATORY_CARE_PROVIDER_SITE_OTHER): Payer: Self-pay | Admitting: Bariatrics

## 2020-06-21 ENCOUNTER — Other Ambulatory Visit: Payer: Self-pay

## 2020-06-21 ENCOUNTER — Ambulatory Visit (INDEPENDENT_AMBULATORY_CARE_PROVIDER_SITE_OTHER): Payer: 59 | Admitting: Bariatrics

## 2020-06-21 VITALS — BP 109/68 | HR 54 | Temp 98.4°F | Ht 69.0 in | Wt 223.0 lb

## 2020-06-21 DIAGNOSIS — E559 Vitamin D deficiency, unspecified: Secondary | ICD-10-CM | POA: Diagnosis not present

## 2020-06-21 DIAGNOSIS — Z6833 Body mass index (BMI) 33.0-33.9, adult: Secondary | ICD-10-CM

## 2020-06-21 DIAGNOSIS — E669 Obesity, unspecified: Secondary | ICD-10-CM | POA: Diagnosis not present

## 2020-06-21 DIAGNOSIS — Z9189 Other specified personal risk factors, not elsewhere classified: Secondary | ICD-10-CM | POA: Diagnosis not present

## 2020-06-21 DIAGNOSIS — R7303 Prediabetes: Secondary | ICD-10-CM

## 2020-06-21 MED ORDER — VITAMIN D (ERGOCALCIFEROL) 1.25 MG (50000 UNIT) PO CAPS: 50000.0000 [IU] | ORAL_CAPSULE | ORAL | 0 refills | Status: DC

## 2020-06-22 NOTE — Progress Notes (Signed)
Chief Complaint:   OBESITY Jem is here to discuss her progress with her obesity treatment plan along with follow-up of her obesity related diagnoses. Danica is on the Category 3 Plan and states she is following her eating plan approximately 85% of the time. Avamae states she is walking 45 minutes 5 times per week.  Today's visit was #: 2 Starting weight: 224 lbs Starting date: 06/07/2020 Today's weight: 223 lbs Today's date: 06/21/2020 Total lbs lost to date: 1 Total lbs lost since last in-office visit: 1  Interim History: Shoshanah is down 1 lb since her last visit. She likes fish and chicken and goes out to eat on the weekends.  Subjective:   1. Pre-diabetes Cyana's A1c is 5.7 and insulin level 6.5.  2. Vitamin D insufficiency Karsten's Vit D level is 36.7. She is taking a multivitamin and Vit D supplement.  3. At risk of diabetes mellitus Lorah is at higher than average risk for developing diabetes due to obesity and pre-diabetes.  Assessment/Plan:   1. Pre-diabetes Dakota will continue to work on weight loss, exercise, and decreasing simple carbohydrates to help decrease the risk of diabetes. Increase healthy fats and protein.  2. Vitamin D insufficiency Low Vitamin D level contributes to fatigue and are associated with obesity, breast, and colon cancer. She agrees to continue to take prescription Vitamin D @50 ,000 IU every week and will follow-up for routine testing of Vitamin D, at least 2-3 times per year to avoid over-replacement. - Vitamin D, Ergocalciferol, (DRISDOL) 1.25 MG (50000 UNIT) CAPS capsule; Take 1 capsule (50,000 Units total) by mouth every 7 (seven) days.  Dispense: 4 capsule; Refill: 0  3. At risk of diabetes mellitus Zaydah was given approximately 15 minutes of diabetes education and counseling today. We discussed intensive lifestyle modifications today with an emphasis on weight loss as well as increasing exercise and decreasing simple  carbohydrates in her diet. We also reviewed medication options with an emphasis on risk versus benefit of those discussed.   Repetitive spaced learning was employed today to elicit superior memory formation and behavioral change.  4. Obesity, current BMI 33  Saga is currently in the action stage of change. As such, her goal is to continue with weight loss efforts. She has agreed to the Category 3 Plan, keeping a food journal and adhering to recommended goals of 1500 calories and 90+ g protein and the Pescatarian Plan + 300 calories.   Meal plan Mindful eating 06/07/2020 labs reviewed with pt Handout: Eating Out  Exercise goals: As is  Behavioral modification strategies: increasing lean protein intake, decreasing simple carbohydrates, increasing vegetables, increasing water intake, decreasing eating out, no skipping meals, meal planning and cooking strategies, keeping healthy foods in the home and planning for success.  Fatma has agreed to follow-up with our clinic in 2 weeks. She was informed of the importance of frequent follow-up visits to maximize her success with intensive lifestyle modifications for her multiple health conditions.   Objective:   Blood pressure 109/68, pulse (!) 54, temperature 98.4 F (36.9 C), height 5\' 9"  (1.753 m), weight 223 lb (101.2 kg), last menstrual period 11/17/2018, SpO2 96 %. Body mass index is 32.93 kg/m.  General: Cooperative, alert, well developed, in no acute distress. HEENT: Conjunctivae and lids unremarkable. Cardiovascular: Regular rhythm.  Lungs: Normal work of breathing. Neurologic: No focal deficits.   Lab Results  Component Value Date   CREATININE 0.85 06/07/2020   BUN 16 06/07/2020   NA 138 06/07/2020  K 4.1 06/07/2020   CL 100 06/07/2020   CO2 22 06/07/2020   Lab Results  Component Value Date   ALT 33 (H) 06/07/2020   AST 25 06/07/2020   ALKPHOS 64 06/07/2020   BILITOT 0.4 06/07/2020   Lab Results  Component Value  Date   HGBA1C 5.7 (H) 06/07/2020   HGBA1C 5.6 07/07/2013   Lab Results  Component Value Date   INSULIN 6.5 06/07/2020   Lab Results  Component Value Date   TSH 2.510 06/07/2020   Lab Results  Component Value Date   CHOL 154 07/07/2013   HDL 49.80 07/07/2013   LDLCALC 86 07/07/2013   TRIG 93.0 07/07/2013   CHOLHDL 3 07/07/2013   Lab Results  Component Value Date   WBC 9.6 12/15/2018   HGB 12.8 12/15/2018   HCT 38.1 12/15/2018   MCV 84.7 12/15/2018   PLT 279 12/15/2018   No results found for: IRON, TIBC, FERRITIN   Attestation Statements:   Reviewed by clinician on day of visit: allergies, medications, problem list, medical history, surgical history, family history, social history, and previous encounter notes.  Edmund Hilda, CMA, am acting as Energy manager for [employer], DO.  I have reviewed the above documentation for accuracy and completeness, and I agree with the above. Corinna Capra, DO

## 2020-06-23 ENCOUNTER — Encounter (INDEPENDENT_AMBULATORY_CARE_PROVIDER_SITE_OTHER): Payer: Self-pay | Admitting: Bariatrics

## 2020-06-28 ENCOUNTER — Other Ambulatory Visit: Payer: Self-pay

## 2020-06-28 ENCOUNTER — Ambulatory Visit
Admission: RE | Admit: 2020-06-28 | Discharge: 2020-06-28 | Disposition: A | Discharge status: Home / Self Care | Payer: 59 | Source: Ambulatory Visit | Attending: Emergency Medicine | Admitting: Emergency Medicine

## 2020-06-28 VITALS — BP 101/68 | HR 87 | Temp 99.1°F | Resp 18

## 2020-06-28 DIAGNOSIS — H6692 Otitis media, unspecified, left ear: Secondary | ICD-10-CM

## 2020-06-28 DIAGNOSIS — R059 Cough, unspecified: Secondary | ICD-10-CM

## 2020-06-28 DIAGNOSIS — J0100 Acute maxillary sinusitis, unspecified: Secondary | ICD-10-CM | POA: Diagnosis not present

## 2020-06-28 DIAGNOSIS — J029 Acute pharyngitis, unspecified: Secondary | ICD-10-CM

## 2020-06-28 MED ORDER — AZITHROMYCIN 250 MG PO TABS: 250.0000 mg | ORAL_TABLET | Freq: Every day | ORAL | 0 refills | Status: DC

## 2020-06-28 NOTE — ED Provider Notes (Signed)
Renaldo Fiddler    CSN: [id number] Arrival date & time: 06/28/20  0849      History   Chief Complaint Chief Complaint  Patient presents with  . Cough    HPI Christy Le is a 50 y.o. female.   Patient presents with 4-day history of ear pain, sore throat, sinus congestion, sinus pressure, cough.  She states the symptoms are recurrent from 1 month ago.  She was seen by her PCP on 06/01/2020; diagnosed with cough, sore throat, flulike symptoms; treated with Tussionex; negative for COVID, flu, strep at that time.  Patient states she was treated with amoxicillin for 10 days a few days after that, which was prescribed by "[facility]eledoc" for ear pain.  She states her symptoms improved on the amoxicillin but did not resolve.  They returned and became worse again 4 days ago.  She denies fever, chills, rash, shortness of breath, vomiting, diarrhea, or other symptoms.  Her medical history includes prediabetes, obesity, GERD, arthritis, back pain, anxiety.  The history is provided by the patient and medical records.    Past Medical History:  Diagnosis Date  . Anxiety   . Arthritis   . Back pain   . Depression   . Gallbladder problem   . GERD (gastroesophageal reflux disease)   . Hip pain   . Joint pain   . Prediabetes   . Vitamin D deficiency     Patient Active Problem List   Diagnosis Date Noted  . Class 1 obesity due to excess calories with body mass index (BMI) of 33.0 to 33.9 in adult 06/13/2020  . Prediabetes 06/08/2020  . Pain of left hip joint 05/17/2020    Past Surgical History:  Procedure Laterality Date  . CHOLECYSTECTOMY    . TUBAL LIGATION      OB History    Gravida  3   Para  3   Term      Preterm      AB      Living        SAB      IAB      Ectopic      Multiple      Live Births               Home Medications    Prior to Admission medications   Medication Sig Start Date End Date Taking? Authorizing Provider  Ascorbic Acid (VITAMIN  C) 100 MG tablet Take 100 mg by mouth daily.   Yes [provider]  azithromycin (ZITHROMAX) 250 MG tablet Take 1 tablet (250 mg total) by mouth daily. Take first 2 tablets together, then 1 every day until finished. 06/28/20  Yes Mickie Bail, NP  FLUoxetine (PROZAC) 20 MG capsule Take 20 mg by mouth daily. 04/24/19  Yes [provider]  vitamin B-12 (CYANOCOBALAMIN) 100 MCG tablet Take 100 mcg by mouth daily.   Yes [provider]  VITAMIN D PO Take 1 tablet by mouth daily.   Yes [provider]  Vitamin D, Ergocalciferol, (DRISDOL) 1.25 MG (50000 UNIT) CAPS capsule Take 1 capsule (50,000 Units total) by mouth every 7 (seven) days. 06/21/20  Yes Corinna Capra A, DO  Zinc 100 MG TABS Take by mouth.   Yes [provider]    Family History Family History  Problem Relation Age of Onset  . Hypertension Mother   . Mental retardation Mother   . Stroke Mother   . Depression Mother   .  Anxiety disorder Mother   . Bipolar disorder Mother   . Obesity Mother   . Heart disease Father   . Congestive Heart Failure Father   . Sleep apnea Father   . Obesity Father   . Cancer Maternal Grandfather        Lung  . Hypertension Maternal Grandfather   . Heart disease Paternal Grandfather   . Hypertension Paternal Grandfather     Social History Social History   Tobacco Use  . Smoking status: Never Smoker  . Smokeless tobacco: Never Used  Substance Use Topics  . Alcohol use: Yes    Comment: occasional  . Drug use: No     Allergies   Other   Review of Systems Review of Systems  Constitutional: Negative for chills and fever.  HENT: Positive for congestion, ear pain, sinus pressure and sore throat.   Respiratory: Positive for cough. Negative for shortness of breath.   Cardiovascular: Negative for chest pain and palpitations.  Gastrointestinal: Negative for abdominal pain, diarrhea and vomiting.  Skin: Negative for color change and rash.  All other  systems reviewed and are negative.    Physical Exam Triage Vital Signs ED Triage Vitals  Enc Vitals Group     BP      Pulse      Resp      Temp      Temp src      SpO2      Weight      Height      Head Circumference      Peak Flow      Pain Score      Pain Loc      Pain Edu?      Excl. in GC?    No data found.  Updated Vital Signs BP 101/68 (BP Location: Left Arm)   Pulse 87   Temp 99.1 F (37.3 C) (Oral)   Resp 18   LMP 11/17/2018 (Approximate)   SpO2 97%   Visual Acuity Right Eye Distance:   Left Eye Distance:   Bilateral Distance:    Right Eye Near:   Left Eye Near:    Bilateral Near:     Physical Exam Vitals and nursing note reviewed.  Constitutional:      General: She is not in acute distress.    Appearance: She is well-developed.  HENT:     Head: Normocephalic and atraumatic.     Right Ear: Tympanic membrane is not erythematous.     Left Ear: Tympanic membrane is erythematous.     Nose: Congestion present.     Mouth/Throat:     Mouth: Mucous membranes are moist.     Pharynx: Posterior oropharyngeal erythema present.  Eyes:     Conjunctiva/sclera: Conjunctivae normal.  Cardiovascular:     Rate and Rhythm: Normal rate and regular rhythm.     Heart sounds: Normal heart sounds.  Pulmonary:     Effort: Pulmonary effort is normal. No respiratory distress.     Breath sounds: Normal breath sounds.  Abdominal:     Palpations: Abdomen is soft.     Tenderness: There is no abdominal tenderness.  Musculoskeletal:     Cervical back: Neck supple.  Skin:    General: Skin is warm and dry.     Findings: No rash.  Neurological:     General: No focal deficit present.     Mental Status: She is alert and oriented to person, place, and time.  Gait: Gait normal.  Psychiatric:        Mood and Affect: Mood normal.        Behavior: Behavior normal.      UC Treatments / Results  Labs (all labs ordered are listed, but only abnormal results are  displayed) Labs Reviewed - No data to display  EKG   Radiology No results found.  Procedures Procedures (including critical care time)  Medications Ordered in UC Medications - No data to display  Initial Impression / Assessment and Plan / UC Course  I have reviewed the triage vital signs and the nursing notes.  Pertinent labs & imaging results that were available during my care of the patient were reviewed by me and considered in my medical decision making (see chart for details).   Acute sinusitis, left otitis media, sore throat, cough.  Treated with Zithromax.  Instructed patient to take Tylenol or ibuprofen as needed for discomfort or fever.  Instructed her to take OTC Mucinex as needed for congestion.  Instructed her to follow-up with her PCP if her symptoms are not improving.  She agrees to plan of care.   Final Clinical Impressions(s) / UC Diagnoses   Final diagnoses:  Acute non-recurrent maxillary sinusitis  Left otitis media, unspecified otitis media type  Sore throat  Cough     Discharge Instructions     Take the Zithromax as directed.    Follow up with your primary care provider if your symptoms are not improving.        ED Prescriptions    Medication Sig Dispense Auth. Provider   azithromycin (ZITHROMAX) 250 MG tablet Take 1 tablet (250 mg total) by mouth daily. Take first 2 tablets together, then 1 every day until finished. 6 tablet Mickie Bail, NP     PDMP not reviewed this encounter.   Mickie Bail, NP 06/28/20 [phone]

## 2020-06-28 NOTE — Discharge Instructions (Signed)
Take the Zithromax as directed.  Follow up with your primary care provider if your symptoms are not improving.    

## 2020-06-28 NOTE — ED Triage Notes (Signed)
Pt reports sore throat, deep cough and congestion x 1 mo.  Was on cough meds then amoxicillin.  Had neg flu, strep, covid tests.  Thought she was improving but worsened again in last few days.

## 2020-07-11 ENCOUNTER — Encounter (INDEPENDENT_AMBULATORY_CARE_PROVIDER_SITE_OTHER): Payer: Self-pay | Admitting: Bariatrics

## 2020-07-11 ENCOUNTER — Other Ambulatory Visit: Payer: Self-pay

## 2020-07-11 ENCOUNTER — Ambulatory Visit (INDEPENDENT_AMBULATORY_CARE_PROVIDER_SITE_OTHER): Payer: 59 | Admitting: Bariatrics

## 2020-07-11 VITALS — BP 107/64 | HR 61 | Temp 97.5°F | Ht 69.0 in | Wt 220.0 lb

## 2020-07-11 DIAGNOSIS — R7303 Prediabetes: Secondary | ICD-10-CM

## 2020-07-11 DIAGNOSIS — E559 Vitamin D deficiency, unspecified: Secondary | ICD-10-CM

## 2020-07-11 DIAGNOSIS — E669 Obesity, unspecified: Secondary | ICD-10-CM | POA: Diagnosis not present

## 2020-07-11 DIAGNOSIS — Z6833 Body mass index (BMI) 33.0-33.9, adult: Secondary | ICD-10-CM | POA: Diagnosis not present

## 2020-07-11 DIAGNOSIS — Z9189 Other specified personal risk factors, not elsewhere classified: Secondary | ICD-10-CM | POA: Diagnosis not present

## 2020-07-11 MED ORDER — VITAMIN D (ERGOCALCIFEROL) 1.25 MG (50000 UNIT) PO CAPS: 50000.0000 [IU] | ORAL_CAPSULE | ORAL | 0 refills | Status: DC

## 2020-07-19 ENCOUNTER — Encounter (INDEPENDENT_AMBULATORY_CARE_PROVIDER_SITE_OTHER): Payer: Self-pay | Admitting: Bariatrics

## 2020-07-19 NOTE — Progress Notes (Signed)
Chief Complaint:   OBESITY Christy Le is here to discuss her progress with her obesity treatment plan along with follow-up of her obesity related diagnoses. Christy Le is on the Category 3 Plan, or keeping a food journal and adhering to recommended goals of 1500 calories and 90+ grams of protein daily, or the Pescatarian Plan + 300 calories and states she is following her eating plan approximately 95% of the time. Christy Le states she is walking for 30 minutes 7 times per week.  Today's visit was #: 3 Starting weight: 224 lbs Starting date: 06/07/2020 Today's weight: 220 lbs Today's date: 07/11/2020 Total lbs lost to date: 4 Total lbs lost since last in-office visit: 3  Interim History: Christy Le is down an additional 3 lbs since she has cut her food intake down. She had vertigo for 3 days.  Subjective:   1. Pre-diabetes [name] is not on medications currently.  2. Vitamin D insufficiency Christy Le is on Vit D, and she denies nausea, vomiting, or muscle weakness.  3. At risk for osteoporosis Christy Le is at higher risk of osteopenia and osteoporosis due to Vitamin D deficiency.   Assessment/Plan:   1. Pre-diabetes Christy Le will continue to work on weight loss, increasing exercise, increasing healthy fats and protein, and decreasing simple carbohydrates to help decrease the risk of diabetes.   2. Vitamin D insufficiency Low Vitamin D level contributes to fatigue and are associated with obesity, breast, and colon cancer. We will refill prescription Vitamin D 50,000 IU every week for 1 month. Christy Le will follow-up for routine testing of Vitamin D, at least 2-3 times per year to avoid over-replacement.  - Vitamin D, Ergocalciferol, (DRISDOL) 1.25 MG (50000 UNIT) CAPS capsule; Take 1 capsule (50,000 Units total) by mouth every 7 (seven) days.  Dispense: 4 capsule; Refill: 0  3. At risk for osteoporosis [name] was given approximately 15 minutes of osteoporosis prevention counseling today.  Christy Le is at risk for osteopenia and osteoporosis due to her Vitamin D deficiency. She was encouraged to take her Vitamin D and follow her higher calcium diet and increase strengthening exercise to help strengthen her bones and decrease her risk of osteopenia and osteoporosis.  Repetitive spaced learning was employed today to elicit superior memory formation and behavioral change.  4. Obesity with current BMI of 32.6 Christy Le is currently in the action stage of change. As such, her goal is to continue with weight loss efforts. She has agreed to keeping a food journal and adhering to recommended goals of 1500 calories and 90+ grams of protein daily or the Pescatarian Plan + 300 calories.   Intentional eating was discussed.  Exercise goals: As is.  Behavioral modification strategies: increasing lean protein intake, decreasing simple carbohydrates, increasing vegetables, increasing water intake, no skipping meals, meal planning and cooking strategies, keeping healthy foods in the home, and planning for success.  Christy Le has agreed to follow-up with our clinic in 2 weeks. She was informed of the importance of frequent follow-up visits to maximize her success with intensive lifestyle modifications for her multiple health conditions.   Objective:   Blood pressure 107/64, pulse 61, temperature (!) 97.5 F (36.4 C), height 5\' 9"  (1.753 m), weight 220 lb (99.8 kg), last menstrual period 11/17/2018, SpO2 97 %. Body mass index is 32.49 kg/m.  General: Cooperative, alert, well developed, in no acute distress. HEENT: Conjunctivae and lids unremarkable. Cardiovascular: Regular rhythm.  Lungs: Normal work of breathing. Neurologic: No focal deficits.   Lab Results  Component Value Date  CREATININE 0.85 06/07/2020   BUN 16 06/07/2020   NA 138 06/07/2020   K 4.1 06/07/2020   CL 100 06/07/2020   CO2 22 06/07/2020   Lab Results  Component Value Date   ALT 33 (H) 06/07/2020   AST 25 06/07/2020    ALKPHOS 64 06/07/2020   BILITOT 0.4 06/07/2020   Lab Results  Component Value Date   HGBA1C 5.7 (H) 06/07/2020   HGBA1C 5.6 07/07/2013   Lab Results  Component Value Date   INSULIN 6.5 06/07/2020   Lab Results  Component Value Date   TSH 2.510 06/07/2020   Lab Results  Component Value Date   CHOL 154 07/07/2013   HDL 49.80 07/07/2013   LDLCALC 86 07/07/2013   TRIG 93.0 07/07/2013   CHOLHDL 3 07/07/2013   Lab Results  Component Value Date   WBC 9.6 12/15/2018   HGB 12.8 12/15/2018   HCT 38.1 12/15/2018   MCV 84.7 12/15/2018   PLT 279 12/15/2018   No results found for: IRON, TIBC, FERRITIN  Attestation Statements:   Reviewed by clinician on day of visit: allergies, medications, problem list, medical history, surgical history, family history, social history, and previous encounter notes.   Trude Mcburney, am acting as Energy manager for [employer], DO.  I have reviewed the above documentation for accuracy and completeness, and I agree with the above. Corinna Capra, DO

## 2020-07-26 ENCOUNTER — Telehealth (INDEPENDENT_AMBULATORY_CARE_PROVIDER_SITE_OTHER): Payer: 59 | Admitting: Bariatrics

## 2020-08-15 ENCOUNTER — Ambulatory Visit (INDEPENDENT_AMBULATORY_CARE_PROVIDER_SITE_OTHER): Payer: 59 | Admitting: Bariatrics

## 2020-08-15 ENCOUNTER — Encounter (INDEPENDENT_AMBULATORY_CARE_PROVIDER_SITE_OTHER): Payer: Self-pay | Admitting: Bariatrics

## 2020-08-15 VITALS — BP 126/73 | HR 92 | Temp 98.8°F | Ht 69.0 in | Wt 222.0 lb

## 2020-08-15 DIAGNOSIS — Z9189 Other specified personal risk factors, not elsewhere classified: Secondary | ICD-10-CM

## 2020-08-15 DIAGNOSIS — E669 Obesity, unspecified: Secondary | ICD-10-CM | POA: Diagnosis not present

## 2020-08-15 DIAGNOSIS — E559 Vitamin D deficiency, unspecified: Secondary | ICD-10-CM

## 2020-08-15 DIAGNOSIS — R7303 Prediabetes: Secondary | ICD-10-CM | POA: Diagnosis not present

## 2020-08-15 DIAGNOSIS — Z6833 Body mass index (BMI) 33.0-33.9, adult: Secondary | ICD-10-CM | POA: Diagnosis not present

## 2020-08-15 MED ORDER — VITAMIN D (ERGOCALCIFEROL) 1.25 MG (50000 UNIT) PO CAPS: 50000.0000 [IU] | ORAL_CAPSULE | ORAL | 0 refills | Status: AC

## 2020-08-17 NOTE — Progress Notes (Signed)
Chief Complaint:   OBESITY Christy Le is here to discuss her progress with her obesity treatment plan along with follow-up of her obesity related diagnoses. Christy Le is on the [employer] and states she is following her eating plan approximately 50% of the time. Christy Le states she is walking 30 minutes 5 times per week.  Today's visit was #: 4 Starting weight: 224 lbs Starting date: 06/07/2020 Today's weight: 222 lbs Today's date: 08/15/2020 Total lbs lost to date: 2 Total lbs lost since last in-office visit: 0  Interim History: Christy Le is up 2 lbs since her last visit on 07/11/20. She states that she has been sick. She had a family function yesterday.  Subjective:   1. Vitamin D insufficiency She is currently taking prescription vitamin D 50,000 IU each week. She denies nausea, vomiting or muscle weakness.  Lab Results  Component Value Date   VD25OH 36.7 06/07/2020   2. Pre-diabetes Christy Le is not taking medication.  3. At risk for diabetes mellitus Christy Le is at higher than average risk for developing diabetes due to obesity and pre-diabetes.  Assessment/Plan:   1. Vitamin D insufficiency Low Vitamin D level contributes to fatigue and are associated with obesity, breast, and colon cancer. She agrees to continue to take prescription Vitamin D @50 ,000 IU every week and will follow-up for routine testing of Vitamin D, at least 2-3 times per year to avoid over-replacement.  Refill- Vitamin D, Ergocalciferol, (DRISDOL) 1.25 MG (50000 UNIT) CAPS capsule; Take 1 capsule (50,000 Units total) by mouth every 7 (seven) days.  Dispense: 4 capsule; Refill: 0  2. Pre-diabetes Christy Le will continue to work on weight loss, exercise, and decreasing simple carbohydrates to help decrease the risk of diabetes.   3. At risk for diabetes mellitus Christy Le was given approximately 15 minutes of diabetes education and counseling today. We discussed intensive lifestyle modifications today with an  emphasis on weight loss as well as increasing exercise and decreasing simple carbohydrates in her diet. We also reviewed medication options with an emphasis on risk versus benefit of those discussed.   Repetitive spaced learning was employed today to elicit superior memory formation and behavioral change.  4. Obesity with current BMI of 32.9  Christy Le is currently in the action stage of change. As such, her goal is to continue with weight loss efforts. She has agreed to the [employer]att Mage.   Meal plan Will adhere more closely to the plan. Increase water and pack your lunch.  Exercise goals:  As is  Behavioral modification strategies: increasing lean protein intake, decreasing simple carbohydrates, increasing vegetables, increasing water intake, decreasing eating out, no skipping meals, meal planning and cooking strategies, keeping healthy foods in the home, and planning for success.  Christy Le has agreed to follow-up with our clinic in 2 weeks. She was informed of the importance of frequent follow-up visits to maximize her success with intensive lifestyle modifications for her multiple health conditions.   Objective:   Blood pressure 126/73, pulse 92, temperature 98.8 F (37.1 C), height 5\' 9"  (1.753 m), weight 222 lb (100.7 kg), last menstrual period 11/17/2018, SpO2 96 %. Body mass index is 32.78 kg/m.  General: Cooperative, alert, well developed, in no acute distress. HEENT: Conjunctivae and lids unremarkable. Cardiovascular: Regular rhythm.  Lungs: Normal work of breathing. Neurologic: No focal deficits.   Lab Results  Component Value Date   CREATININE 0.85 06/07/2020   BUN 16 06/07/2020   NA 138 06/07/2020   K 4.1 06/07/2020   CL  100 06/07/2020   CO2 22 06/07/2020   Lab Results  Component Value Date   ALT 33 (H) 06/07/2020   AST 25 06/07/2020   ALKPHOS 64 06/07/2020   BILITOT 0.4 06/07/2020   Lab Results  Component Value Date   HGBA1C 5.7 (H) 06/07/2020   HGBA1C  5.6 07/07/2013   Lab Results  Component Value Date   INSULIN 6.5 06/07/2020   Lab Results  Component Value Date   TSH 2.510 06/07/2020   Lab Results  Component Value Date   CHOL 154 07/07/2013   HDL 49.80 07/07/2013   LDLCALC 86 07/07/2013   TRIG 93.0 07/07/2013   CHOLHDL 3 07/07/2013   Lab Results  Component Value Date   VD25OH 36.7 06/07/2020   Lab Results  Component Value Date   WBC 9.6 12/15/2018   HGB 12.8 12/15/2018   HCT 38.1 12/15/2018   MCV 84.7 12/15/2018   PLT 279 12/15/2018   No results found for: IRON, TIBC, FERRITIN  Attestation Statements:   Reviewed by clinician on day of visit: allergies, medications, problem list, medical history, surgical history, family history, social history, and previous encounter notes.  Edmund Hilda, CMA, am acting as Energy manager for [employer], DO.  I have reviewed the above documentation for accuracy and completeness, and I agree with the above. Corinna Capra, DO

## 2020-08-18 ENCOUNTER — Encounter (INDEPENDENT_AMBULATORY_CARE_PROVIDER_SITE_OTHER): Payer: Self-pay | Admitting: Bariatrics

## 2020-08-29 ENCOUNTER — Ambulatory Visit (INDEPENDENT_AMBULATORY_CARE_PROVIDER_SITE_OTHER): Payer: 59 | Admitting: Bariatrics

## 2020-09-06 ENCOUNTER — Encounter (INDEPENDENT_AMBULATORY_CARE_PROVIDER_SITE_OTHER): Payer: Self-pay | Admitting: Bariatrics

## 2020-09-13 ENCOUNTER — Ambulatory Visit (INDEPENDENT_AMBULATORY_CARE_PROVIDER_SITE_OTHER): Payer: BC Managed Care – PPO | Admitting: Bariatrics

## 2020-09-29 ENCOUNTER — Encounter: Payer: Self-pay | Admitting: Emergency Medicine

## 2020-09-29 ENCOUNTER — Emergency Department
Admission: EM | Admit: 2020-09-29 | Discharge: 2020-09-29 | Disposition: A | Discharge status: Home / Self Care | Payer: BC Managed Care – PPO | Attending: Student in an Organized Health Care Education/Training Program | Admitting: Student in an Organized Health Care Education/Training Program

## 2020-09-29 ENCOUNTER — Emergency Department: Payer: BC Managed Care – PPO

## 2020-09-29 ENCOUNTER — Other Ambulatory Visit: Payer: Self-pay

## 2020-09-29 DIAGNOSIS — R519 Headache, unspecified: Secondary | ICD-10-CM | POA: Diagnosis present

## 2020-09-29 DIAGNOSIS — Z20822 Contact with and (suspected) exposure to covid-19: Secondary | ICD-10-CM | POA: Diagnosis not present

## 2020-09-29 DIAGNOSIS — M542 Cervicalgia: Secondary | ICD-10-CM | POA: Diagnosis not present

## 2020-09-29 LAB — CBC WITH DIFFERENTIAL/PLATELET
Abs Immature Granulocytes: 0.02 10*3/uL (ref 0.00–0.07)
Basophils Absolute: 0.1 10*3/uL (ref 0.0–0.1)
Basophils Relative: 1 %
Eosinophils Absolute: 0.2 10*3/uL (ref 0.0–0.5)
Eosinophils Relative: 3 %
HCT: 39.8 % (ref 36.0–46.0)
Hemoglobin: 13.6 g/dL (ref 12.0–15.0)
Immature Granulocytes: 0 %
Lymphocytes Relative: 29 %
Lymphs Abs: 2.6 10*3/uL (ref 0.7–4.0)
MCH: 29.5 pg (ref 26.0–34.0)
MCHC: 34.2 g/dL (ref 30.0–36.0)
MCV: 86.3 fL (ref 80.0–100.0)
Monocytes Absolute: 0.7 10*3/uL (ref 0.1–1.0)
Monocytes Relative: 7 %
Neutro Abs: 5.3 10*3/uL (ref 1.7–7.7)
Neutrophils Relative %: 60 %
Platelets: 234 10*3/uL (ref 150–400)
RBC: 4.61 MIL/uL (ref 3.87–5.11)
RDW: 13.3 % (ref 11.5–15.5)
WBC: 8.9 10*3/uL (ref 4.0–10.5)
nRBC: 0 % (ref 0.0–0.2)

## 2020-09-29 LAB — BASIC METABOLIC PANEL
Anion gap: 7 (ref 5–15)
BUN: 21 mg/dL — ABNORMAL HIGH (ref 6–20)
CO2: 29 mmol/L (ref 22–32)
Calcium: 9.5 mg/dL (ref 8.9–10.3)
Chloride: 102 mmol/L (ref 98–111)
Creatinine, Ser: 0.86 mg/dL (ref 0.44–1.00)
GFR, Estimated: >60 mL/min (ref >60)
Glucose, Bld: 102 mg/dL — ABNORMAL HIGH (ref 70–99)
Potassium: 4.1 mmol/L (ref 3.5–5.1)
Sodium: 138 mmol/L (ref 135–145)

## 2020-09-29 LAB — RESP PANEL BY RT-PCR (FLU A&B, COVID) ARPGX2
Influenza A by PCR: NEGATIVE
Influenza B by PCR: NEGATIVE
SARS Coronavirus 2 by RT PCR: NEGATIVE

## 2020-09-29 MED ORDER — DIPHENHYDRAMINE HCL 50 MG/ML IJ SOLN
12.5000 mg | Freq: Once | INTRAMUSCULAR | Status: AC
  Administered 2020-09-29: 12.5 mg via INTRAVENOUS
  Filled 2020-09-29: qty 1

## 2020-09-29 MED ORDER — METOCLOPRAMIDE HCL 10 MG PO TABS: 10.0000 mg | ORAL_TABLET | Freq: Four times a day (QID) | ORAL | 0 refills | Status: DC | PRN

## 2020-09-29 MED ORDER — IOHEXOL 350 MG/ML SOLN
100.0000 mL | Freq: Once | INTRAVENOUS | Status: AC | PRN
  Administered 2020-09-29: 100 mL via INTRAVENOUS
  Filled 2020-09-29: qty 100

## 2020-09-29 MED ORDER — ACETAMINOPHEN 325 MG PO TABS
650.0000 mg | ORAL_TABLET | Freq: Once | ORAL | Status: AC
  Administered 2020-09-29: 650 mg via ORAL
  Filled 2020-09-29: qty 2

## 2020-09-29 MED ORDER — PROCHLORPERAZINE EDISYLATE 10 MG/2ML IJ SOLN
10.0000 mg | Freq: Once | INTRAMUSCULAR | Status: AC
  Administered 2020-09-29: 10 mg via INTRAVENOUS
  Filled 2020-09-29: qty 2

## 2020-09-29 NOTE — ED Triage Notes (Signed)
Pt via POV from home. Pt c/o headache and neck pain for the last 2 weeks. Denies hx of migraine. Pt states she is sensitive to lights and sounds. Denies blurred vision.  Pt A&Ox4 and NAD

## 2020-09-29 NOTE — Discharge Instructions (Addendum)

## 2020-09-29 NOTE — ED Provider Notes (Signed)
[facility] Emergency Department Provider Note    Event Date/Time   First MD Initiated Contact with Patient 09/29/20 1529     (approximate)  I have reviewed the triage vital signs and the nursing notes.   HISTORY  Chief Complaint Headache    HPI Christy Le is a 50 y.o. female below listed past medical history presents to the ER for evaluation of generalized headache and neck pain that started last week.  Does have remote history of migraines with his feels different no fevers no chills.  No visual disturbance states having some photophobia.  No numbness or tingling.  No history of aneurysm.  Past Medical History:  Diagnosis Date   Anxiety    Arthritis    Back pain    Depression    Gallbladder problem    GERD (gastroesophageal reflux disease)    Hip pain    Joint pain    Prediabetes    Vitamin D deficiency    Family History  Problem Relation Age of Onset   Hypertension Mother    Mental retardation Mother    Stroke Mother    Depression Mother    Anxiety disorder Mother    Bipolar disorder Mother    Obesity Mother    Heart disease Father    Congestive Heart Failure Father    Sleep apnea Father    Obesity Father    Cancer Maternal Grandfather        Lung   Hypertension Maternal Grandfather    Heart disease Paternal Grandfather    Hypertension Paternal Grandfather    Past Surgical History:  Procedure Laterality Date   CHOLECYSTECTOMY     TUBAL LIGATION     Patient Active Problem List   Diagnosis Date Noted   Class 1 obesity due to excess calories with body mass index (BMI) of 33.0 to 33.9 in adult 06/13/2020   Prediabetes 06/08/2020   Pain of left hip joint 05/17/2020      Prior to Admission medications   Medication Sig Start Date End Date Taking? Authorizing Provider  metoCLOPramide (REGLAN) 10 MG tablet Take 1 tablet (10 mg total) by mouth every 6 (six) hours as needed for nausea. 09/29/20 09/29/21 Yes Willy Eddy, MD   Ascorbic Acid (VITAMIN C) 100 MG tablet Take 100 mg by mouth daily.    [provider]  FLUoxetine (PROZAC) 20 MG capsule Take 20 mg by mouth daily. 04/24/19   [provider]  vitamin B-12 (CYANOCOBALAMIN) 100 MCG tablet Take 100 mcg by mouth daily.    [provider]  VITAMIN D PO Take 1 tablet by mouth daily.    [provider]  Vitamin D, Ergocalciferol, (DRISDOL) 1.25 MG (50000 UNIT) CAPS capsule Take 1 capsule (50,000 Units total) by mouth every 7 (seven) days. 08/15/20   Corinna Capra A, DO  Zinc 100 MG TABS Take by mouth.    [provider]    Allergies Other    Social History Social History   Tobacco Use   Smoking status: Never   Smokeless tobacco: Never  Substance Use Topics   Alcohol use: Yes    Comment: occasional   Drug use: No    Review of Systems Patient denies headaches, rhinorrhea, blurry vision, numbness, shortness of breath, chest pain, edema, cough, abdominal pain, nausea, vomiting, diarrhea, dysuria, fevers, rashes or hallucinations unless otherwise stated above in HPI. ____________________________________________   PHYSICAL EXAM:  VITAL SIGNS: Vitals:   09/29/20 1653 09/29/20 1700  BP:  114/74  Pulse: (!) 52 (!) 52  Resp:  17  Temp:    SpO2: 98% 99%    Constitutional: Alert and oriented.  Eyes: Conjunctivae are normal.  Head: Atraumatic. Nose: No congestion/rhinnorhea. Mouth/Throat: Mucous membranes are moist.   Neck: No stridor. Painless ROM.  Cardiovascular: Normal rate, regular rhythm. Grossly normal heart sounds.  Good peripheral circulation. Respiratory: Normal respiratory effort.  No retractions. Lungs CTAB. Gastrointestinal: Soft and nontender. No distention. No abdominal bruits. No CVA tenderness. Genitourinary:  Musculoskeletal: No lower extremity tenderness nor edema.  No joint effusions. Neurologic:  Normal speech and language. No gross focal neurologic deficits are appreciated. No facial  droop Skin:  Skin is warm, dry and intact. No rash noted. Psychiatric: Mood and affect are normal. Speech and behavior are normal.  ____________________________________________   LABS (all labs ordered are listed, but only abnormal results are displayed)  Results for orders placed or performed during the hospital encounter of 09/29/20 (from the past 24 hour(s))  CBC with Differential/Platelet     Status: None   Collection Time: 09/29/20  3:55 PM  Result Value Ref Range   WBC 8.9 4.0 - 10.5 K/uL   RBC 4.61 3.87 - 5.11 MIL/uL   Hemoglobin 13.6 12.0 - 15.0 g/dL   HCT 13.2 44.0 - 10.2 %   MCV 86.3 80.0 - 100.0 fL   MCH 29.5 26.0 - 34.0 pg   MCHC 34.2 30.0 - 36.0 g/dL   RDW 72.5 36.6 - 44.0 %   Platelets 234 150 - 400 K/uL   nRBC 0.0 0.0 - 0.2 %   Neutrophils Relative % 60 %   Neutro Abs 5.3 1.7 - 7.7 K/uL   Lymphocytes Relative 29 %   Lymphs Abs 2.6 0.7 - 4.0 K/uL   Monocytes Relative 7 %   Monocytes Absolute 0.7 0.1 - 1.0 K/uL   Eosinophils Relative 3 %   Eosinophils Absolute 0.2 0.0 - 0.5 K/uL   Basophils Relative 1 %   Basophils Absolute 0.1 0.0 - 0.1 K/uL   Immature Granulocytes 0 %   Abs Immature Granulocytes 0.02 0.00 - 0.07 K/uL  Basic metabolic panel     Status: Abnormal   Collection Time: 09/29/20  3:55 PM  Result Value Ref Range   Sodium 138 135 - 145 mmol/L   Potassium 4.1 3.5 - 5.1 mmol/L   Chloride 102 98 - 111 mmol/L   CO2 29 22 - 32 mmol/L   Glucose, Bld 102 (H) 70 - 99 mg/dL   BUN 21 (H) 6 - 20 mg/dL   Creatinine, Ser 3.47 0.44 - 1.00 mg/dL   Calcium 9.5 8.9 - 42.5 mg/dL   GFR, Estimated >95 >63 mL/min   Anion gap 7 5 - 15  Resp Panel by RT-PCR (Flu A&B, Covid) Nasopharyngeal Swab     Status: None   Collection Time: 09/29/20  3:57 PM   Specimen: Nasopharyngeal Swab; Nasopharyngeal(NP) swabs in vial transport medium  Result Value Ref Range   SARS Coronavirus 2 by RT PCR NEGATIVE NEGATIVE   Influenza A by PCR NEGATIVE NEGATIVE   Influenza B by PCR  NEGATIVE NEGATIVE   ____________________________________________ ____________________________________________  RADIOLOGY  I personally reviewed all radiographic images ordered to evaluate for the above acute complaints and reviewed radiology reports and findings.  These findings were personally discussed with the patient.  Please see medical record for radiology report.  ____________________________________________   PROCEDURES  Procedure(s) performed:  Procedures    Critical Care performed:  no ____________________________________________   INITIAL IMPRESSION / ASSESSMENT AND PLAN / ED COURSE  Pertinent labs & imaging results that were available during my care of the patient were reviewed by me and considered in my medical decision making (see chart for details).   DDX: Migraine, tension, GCA, COVID, sinusitis, cluster, SAH, aneurysm  Christy Le is a 50 y.o. who presents to the ED with presentation as described above.  Patient with tach.  Nontoxic.  Given age and history CT imaging ordered for above differential was reassuring.  Still consistent with SAH no consistent with meningitis.  Pain improved with migraine cocktail.  Stable and appropriate for outpatient follow-up     The patient was evaluated in Emergency Department today for the symptoms described in the history of present illness. He/she was evaluated in the context of the global COVID-19 pandemic, which necessitated consideration that the patient might be at risk for infection with the SARS-CoV-2 virus that causes COVID-19. Institutional protocols and algorithms that pertain to the evaluation of patients at risk for COVID-19 are in a state of rapid change based on information released by regulatory bodies including the CDC and federal and state organizations. These policies and algorithms were followed during the patient's care in the ED.  As part of my medical decision making, I reviewed the following data within  the electronic MEDICAL RECORD NUMBER Nursing notes reviewed and incorporated, Labs reviewed, notes from prior ED visits and [place] Controlled Substance Database   ____________________________________________   FINAL CLINICAL IMPRESSION(S) / ED DIAGNOSES  Final diagnoses:  Bad headache      NEW MEDICATIONS STARTED DURING THIS VISIT:  New Prescriptions   METOCLOPRAMIDE (REGLAN) 10 MG TABLET    Take 1 tablet (10 mg total) by mouth every 6 (six) hours as needed for nausea.     Note:  This document was prepared using Dragon voice recognition software and may include unintentional dictation errors.    Willy Eddy, MD 09/29/20 [phone]

## 2021-01-11 ENCOUNTER — Ambulatory Visit (INDEPENDENT_AMBULATORY_CARE_PROVIDER_SITE_OTHER): Payer: BC Managed Care – PPO | Admitting: Family Medicine

## 2021-08-22 NOTE — Pre-Procedure Instructions (Signed)
Surgical Instructions    Your procedure is scheduled on Thursday, July 27th.  Report to [facility] Main Entrance "A" at 9:00 A.M., then check in with the Admitting office.  Call this number if you have problems the morning of surgery:  [phone]   If you have any questions prior to your surgery date call [phone]: Open Monday-Friday 8am-4pm    Remember:  Do not eat after midnight the night before your surgery  You may drink clear liquids until 8:00 a.m. the morning of your surgery.   Clear liquids allowed are: Water, Non-Citrus Juices (without pulp), Carbonated Beverages, Clear Tea, Black Coffee ONLY (NO MILK, CREAM OR POWDERED CREAMER of any kind), and Gatorade    Take these medicines the morning of surgery with A SIP OF WATER:  amoxicillin-clavulanate (AUGMENTIN)  escitalopram (LEXAPRO)  omeprazole (PRILOSEC)  As of today, STOP taking any Aspirin (unless otherwise instructed by your surgeon) Aleve, Naproxen, Ibuprofen, Motrin, Advil, Goody's, BC's, all herbal medications, fish oil, and all vitamins.           Do not wear jewelry or makeup Do not wear lotions, powders, perfumes, or deodorant. Do not shave 48 hours prior to surgery.  Do not bring valuables to the hospital. Do not wear nail polish, gel polish, artificial nails, or any other type of covering on natural nails (fingers and toes) If you have artificial nails or gel coating that need to be removed by a nail salon, please have this removed prior to surgery. Artificial nails or gel coating may interfere with anesthesia's ability to adequately monitor your vital signs.  [facility] is not responsible for any belongings or valuables. .   Do NOT Smoke (Tobacco/Vaping)  24 hours prior to your procedure  If you use a CPAP at night, you may bring your mask for your overnight stay.   Contacts, glasses, hearing aids, dentures or partials may not be worn into surgery, please bring cases for these belongings   For  patients admitted to the hospital, discharge time will be determined by your treatment team.   Patients discharged the day of surgery will not be allowed to drive home, and someone needs to stay with them for 24 hours.   SURGICAL WAITING ROOM VISITATION Patients having surgery or a procedure may have no more than 2 support people in the waiting area - these visitors may rotate.   Children under the age of 63 must have an adult with them who is not the patient. If the patient needs to stay at the hospital during part of their recovery, the visitor guidelines for inpatient rooms apply. Pre-op nurse will coordinate an appropriate time for 1 support person to accompany patient in pre-op.  This support person may not rotate.   Please refer to the [facility] website for the visitor guidelines for Inpatients (after your surgery is over and you are in a regular room).    Special instructions:    Oral Hygiene is also important to reduce your risk of infection.  Remember - BRUSH YOUR TEETH THE MORNING OF SURGERY WITH YOUR REGULAR TOOTHPASTE   [facility]- Preparing For Surgery  Before surgery, you can play an important role. Because skin is not sterile, your skin needs to be as free of germs as possible. You can reduce the number of germs on your skin by washing with CHG (chlorahexidine gluconate) Soap before surgery.  CHG is an antiseptic cleaner which kills germs and bonds with the skin to continue killing germs  even after washing.     Please do not use if you have an allergy to CHG or antibacterial soaps. If your skin becomes reddened/irritated stop using the CHG.  Do not shave (including legs and underarms) for at least 48 hours prior to first CHG shower. It is OK to shave your face.  Please follow these instructions carefully.     Shower the NIGHT BEFORE SURGERY and the MORNING OF SURGERY with CHG Soap.   If you chose to wash your hair, wash your hair first as usual with your normal  shampoo. After you shampoo, rinse your hair and body thoroughly to remove the shampoo.  Then [employer] and genitals (private parts) with your normal soap and rinse thoroughly to remove soap.  After that Use CHG Soap as you would any other liquid soap. You can apply CHG directly to the skin and wash gently with a scrungie or a clean washcloth.   Apply the CHG Soap to your body ONLY FROM THE NECK DOWN.  Do not use on open wounds or open sores. Avoid contact with your eyes, ears, mouth and genitals (private parts). Wash Face and genitals (private parts)  with your normal soap.   Wash thoroughly, paying special attention to the area where your surgery will be performed.  Thoroughly rinse your body with warm water from the neck down.  DO NOT shower/wash with your normal soap after using and rinsing off the CHG Soap.  Pat yourself dry with a CLEAN TOWEL.  Wear CLEAN PAJAMAS to bed the night before surgery  Place CLEAN SHEETS on your bed the night before your surgery  DO NOT SLEEP WITH PETS.   Day of Surgery:  Take a shower with CHG soap. Wear Clean/Comfortable clothing the morning of surgery Do not apply any deodorants/lotions.   Remember to brush your teeth WITH YOUR REGULAR TOOTHPASTE.    If you received a COVID test during your pre-op visit, it is requested that you wear a mask when out in public, stay away from anyone that may not be feeling well, and notify your surgeon if you develop symptoms. If you have been in contact with anyone that has tested positive in the last 10 days, please notify your surgeon.    Please read over the following fact sheets that you were given.

## 2021-08-23 ENCOUNTER — Encounter (HOSPITAL_COMMUNITY)
Admission: RE | Admit: 2021-08-23 | Discharge: 2021-08-23 | Disposition: A | Discharge status: Home / Self Care | Payer: BC Managed Care – PPO | Source: Ambulatory Visit | Attending: Orthopedic Surgery | Admitting: Orthopedic Surgery

## 2021-08-23 ENCOUNTER — Ambulatory Visit: Payer: Self-pay | Admitting: Orthopedic Surgery

## 2021-08-23 ENCOUNTER — Encounter (HOSPITAL_COMMUNITY): Payer: Self-pay

## 2021-08-23 ENCOUNTER — Other Ambulatory Visit: Payer: Self-pay

## 2021-08-23 VITALS — BP 108/62 | HR 56 | Temp 98.6°F | Resp 17 | Ht 69.0 in | Wt 224.6 lb

## 2021-08-23 DIAGNOSIS — Z20822 Contact with and (suspected) exposure to covid-19: Secondary | ICD-10-CM | POA: Diagnosis not present

## 2021-08-23 DIAGNOSIS — Z01812 Encounter for preprocedural laboratory examination: Secondary | ICD-10-CM | POA: Diagnosis present

## 2021-08-23 DIAGNOSIS — R7303 Prediabetes: Secondary | ICD-10-CM | POA: Diagnosis not present

## 2021-08-23 DIAGNOSIS — Z01818 Encounter for other preprocedural examination: Secondary | ICD-10-CM

## 2021-08-23 HISTORY — DX: Other complications of anesthesia, initial encounter: T88.59XA

## 2021-08-23 HISTORY — DX: Prediabetes: R73.03

## 2021-08-23 LAB — SARS CORONAVIRUS 2 (TAT 6-24 HRS): SARS Coronavirus 2: NEGATIVE

## 2021-08-23 LAB — SURGICAL PCR SCREEN
MRSA, PCR: NEGATIVE
Staphylococcus aureus: NEGATIVE

## 2021-08-23 NOTE — Progress Notes (Addendum)
PCP - Antoine Primas Cardiologist - denies cardiologist  PPM/ICD - denies Chest x-ray - 08/09/21 in care everywhere EKG - denies recent EKG Stress Test - pt reports having a normal stress test over 10 years ago- unsure where this was done at [facility] - denies Cardiac Cath - denies  Sleep Study - denies  Pt is listed as "pre-diabetic" in chart, but states that she is not on any medications and does not check her blood sugar. Last Hgb A1c 5.3 on 08/04/21 in care everywhere Blood Thinner Instructions: N/A Aspirin Instructions: N/A  ERAS Protcol -ERAS per protocol- no orders  COVID TEST- 08/23/2021 Patient states she has been having some congestion, runny nose and feels like her breathing is not normal. Pt states she had CXR done recently and was started on amoxicillin. Pt states she has been taking this for 3 days and does not feel any better yet, but her breathing feels worse. Pt states that it feels worse when she goes outside. Pt states she will call her PCP regarding this and possible need for inhaler. Pt denies fever, shortness of breathe, chills, aches or other symptoms currently. Pt denies having COVID test done recently. COVID test done at PAT. Sheri with Dr. Shon Baton office notified that COVID test was done, also that pre-op orders need to be placed for patient.    Anesthesia review: no  Patient denies shortness of breath, fever, cough and chest pain at PAT appointment   All instructions explained to the patient, with a verbal understanding of the material. Patient agrees to go over the instructions while at home for a better understanding. Patient also instructed to self quarantine after being tested for COVID-19. The opportunity to ask questions was provided.

## 2021-08-31 ENCOUNTER — Ambulatory Visit (HOSPITAL_COMMUNITY): Payer: BC Managed Care – PPO | Admitting: Certified Registered Nurse Anesthetist

## 2021-08-31 ENCOUNTER — Ambulatory Visit (HOSPITAL_COMMUNITY)
Admission: RE | Admit: 2021-08-31 | Discharge: 2021-08-31 | Disposition: A | Discharge status: Home / Self Care | Payer: BC Managed Care – PPO | Source: Ambulatory Visit | Attending: Orthopedic Surgery | Admitting: Orthopedic Surgery

## 2021-08-31 ENCOUNTER — Encounter (HOSPITAL_COMMUNITY)
Admission: RE | Disposition: A | Discharge status: Home / Self Care | Payer: Self-pay | Source: Ambulatory Visit | Attending: Orthopedic Surgery

## 2021-08-31 ENCOUNTER — Other Ambulatory Visit: Payer: Self-pay

## 2021-08-31 ENCOUNTER — Ambulatory Visit (HOSPITAL_COMMUNITY): Payer: BC Managed Care – PPO

## 2021-08-31 ENCOUNTER — Encounter (HOSPITAL_COMMUNITY): Payer: Self-pay | Admitting: Orthopedic Surgery

## 2021-08-31 DIAGNOSIS — M5416 Radiculopathy, lumbar region: Secondary | ICD-10-CM | POA: Diagnosis not present

## 2021-08-31 DIAGNOSIS — M7138 Other bursal cyst, other site: Secondary | ICD-10-CM | POA: Insufficient documentation

## 2021-08-31 DIAGNOSIS — M48061 Spinal stenosis, lumbar region without neurogenic claudication: Secondary | ICD-10-CM | POA: Insufficient documentation

## 2021-08-31 HISTORY — PX: LUMBAR LAMINECTOMY/DECOMPRESSION MICRODISCECTOMY: SHX5026

## 2021-08-31 SURGERY — LUMBAR LAMINECTOMY/DECOMPRESSION MICRODISCECTOMY 1 LEVEL
Anesthesia: General | Site: Spine Lumbar | Laterality: Right

## 2021-08-31 MED ORDER — CEFAZOLIN SODIUM-DEXTROSE 2-4 GM/100ML-% IV SOLN
2.0000 g | INTRAVENOUS | Status: AC
  Administered 2021-08-31: 2 g via INTRAVENOUS

## 2021-08-31 MED ORDER — TRANEXAMIC ACID-NACL 1000-0.7 MG/100ML-% IV SOLN
INTRAVENOUS | Status: AC
  Filled 2021-08-31: qty 100

## 2021-08-31 MED ORDER — FENTANYL CITRATE (PF) 100 MCG/2ML IJ SOLN
25.0000 ug | INTRAMUSCULAR | Status: DC | PRN
  Administered 2021-08-31 (×2): 50 ug via INTRAVENOUS

## 2021-08-31 MED ORDER — TRANEXAMIC ACID-NACL 1000-0.7 MG/100ML-% IV SOLN
1000.0000 mg | INTRAVENOUS | Status: AC
  Administered 2021-08-31: 1000 mg via INTRAVENOUS
  Filled 2021-08-31: qty 100

## 2021-08-31 MED ORDER — METHYLPREDNISOLONE ACETATE 40 MG/ML IJ SUSP
INTRAMUSCULAR | Status: AC
  Filled 2021-08-31: qty 1

## 2021-08-31 MED ORDER — FENTANYL CITRATE (PF) 250 MCG/5ML IJ SOLN
INTRAMUSCULAR | Status: AC
  Filled 2021-08-31: qty 5

## 2021-08-31 MED ORDER — HYDROCODONE-ACETAMINOPHEN 5-325 MG PO TABS: 1.0000 | ORAL_TABLET | ORAL | Status: DC | PRN

## 2021-08-31 MED ORDER — 0.9 % SODIUM CHLORIDE (POUR BTL) OPTIME
TOPICAL | Status: DC | PRN
  Administered 2021-08-31: 1000 mL

## 2021-08-31 MED ORDER — ROCURONIUM BROMIDE 10 MG/ML (PF) SYRINGE
PREFILLED_SYRINGE | INTRAVENOUS | Status: DC | PRN
  Administered 2021-08-31: 40 mg via INTRAVENOUS
  Administered 2021-08-31: 20 mg via INTRAVENOUS

## 2021-08-31 MED ORDER — METHOCARBAMOL 1000 MG/10ML IJ SOLN: 500.0000 mg | Freq: Four times a day (QID) | INTRAVENOUS | Status: DC | PRN

## 2021-08-31 MED ORDER — TRAMADOL HCL 50 MG PO TABS
50.0000 mg | ORAL_TABLET | Freq: Once | ORAL | Status: AC | PRN
  Administered 2021-08-31: 50 mg via ORAL

## 2021-08-31 MED ORDER — THROMBIN 20000 UNITS EX SOLR
CUTANEOUS | Status: AC
  Filled 2021-08-31: qty 20000

## 2021-08-31 MED ORDER — LACTATED RINGERS IV SOLN: INTRAVENOUS | Status: DC

## 2021-08-31 MED ORDER — ORAL CARE MOUTH RINSE: 15.0000 mL | Freq: Once | OROMUCOSAL | Status: AC

## 2021-08-31 MED ORDER — TRAMADOL HCL 50 MG PO TABS
ORAL_TABLET | ORAL | Status: AC
  Filled 2021-08-31: qty 1

## 2021-08-31 MED ORDER — METHOCARBAMOL 500 MG PO TABS
ORAL_TABLET | ORAL | Status: AC
  Filled 2021-08-31: qty 1

## 2021-08-31 MED ORDER — HYDROCODONE-ACETAMINOPHEN 10-325 MG PO TABS: 2.0000 | ORAL_TABLET | ORAL | Status: DC | PRN

## 2021-08-31 MED ORDER — KETOROLAC TROMETHAMINE 0.5 % OP SOLN
1.0000 [drp] | Freq: Four times a day (QID) | OPHTHALMIC | Status: DC
  Administered 2021-08-31: 1 [drp] via OPHTHALMIC

## 2021-08-31 MED ORDER — THROMBIN 20000 UNITS EX SOLR: CUTANEOUS | Status: DC | PRN

## 2021-08-31 MED ORDER — MIDAZOLAM HCL 2 MG/2ML IJ SOLN
INTRAMUSCULAR | Status: AC
  Filled 2021-08-31: qty 2

## 2021-08-31 MED ORDER — PROPOFOL 10 MG/ML IV BOLUS
INTRAVENOUS | Status: DC | PRN
  Administered 2021-08-31: 200 mg via INTRAVENOUS

## 2021-08-31 MED ORDER — LIDOCAINE 2% (20 MG/ML) 5 ML SYRINGE
INTRAMUSCULAR | Status: DC | PRN
  Administered 2021-08-31: 60 mg via INTRAVENOUS

## 2021-08-31 MED ORDER — ONDANSETRON HCL 4 MG/2ML IJ SOLN
INTRAMUSCULAR | Status: DC | PRN
  Administered 2021-08-31: 4 mg via INTRAVENOUS

## 2021-08-31 MED ORDER — METHOCARBAMOL 500 MG PO TABS
500.0000 mg | ORAL_TABLET | Freq: Four times a day (QID) | ORAL | Status: DC | PRN
  Administered 2021-08-31: 500 mg via ORAL

## 2021-08-31 MED ORDER — KETOROLAC TROMETHAMINE 0.5 % OP SOLN
OPHTHALMIC | Status: AC
  Filled 2021-08-31: qty 5

## 2021-08-31 MED ORDER — BUPIVACAINE-EPINEPHRINE (PF) 0.25% -1:200000 IJ SOLN
INTRAMUSCULAR | Status: AC
  Filled 2021-08-31: qty 30

## 2021-08-31 MED ORDER — FENTANYL CITRATE (PF) 100 MCG/2ML IJ SOLN
INTRAMUSCULAR | Status: AC
  Filled 2021-08-31: qty 2

## 2021-08-31 MED ORDER — KETOROLAC TROMETHAMINE 30 MG/ML IJ SOLN
INTRAMUSCULAR | Status: DC | PRN
  Administered 2021-08-31: 30 mg via INTRAVENOUS

## 2021-08-31 MED ORDER — PROPOFOL 500 MG/50ML IV EMUL
INTRAVENOUS | Status: DC | PRN
  Administered 2021-08-31: 75 ug/kg/min via INTRAVENOUS

## 2021-08-31 MED ORDER — HYDROCODONE-ACETAMINOPHEN 10-325 MG PO TABS: 1.0000 | ORAL_TABLET | Freq: Four times a day (QID) | ORAL | 0 refills | Status: AC | PRN

## 2021-08-31 MED ORDER — LIDOCAINE 2% (20 MG/ML) 5 ML SYRINGE
INTRAMUSCULAR | Status: AC
  Filled 2021-08-31: qty 5

## 2021-08-31 MED ORDER — DEXAMETHASONE SODIUM PHOSPHATE 10 MG/ML IJ SOLN
INTRAMUSCULAR | Status: DC | PRN
  Administered 2021-08-31: 10 mg via INTRAVENOUS

## 2021-08-31 MED ORDER — CEFAZOLIN SODIUM-DEXTROSE 2-4 GM/100ML-% IV SOLN
INTRAVENOUS | Status: AC
  Filled 2021-08-31: qty 100

## 2021-08-31 MED ORDER — METHOCARBAMOL 500 MG PO TABS: 500.0000 mg | ORAL_TABLET | Freq: Three times a day (TID) | ORAL | 0 refills | Status: AC | PRN

## 2021-08-31 MED ORDER — ROCURONIUM BROMIDE 10 MG/ML (PF) SYRINGE
PREFILLED_SYRINGE | INTRAVENOUS | Status: AC
  Filled 2021-08-31: qty 10

## 2021-08-31 MED ORDER — PHENYLEPHRINE HCL-NACL 20-0.9 MG/250ML-% IV SOLN
INTRAVENOUS | Status: DC | PRN
  Administered 2021-08-31: 20 ug/min via INTRAVENOUS

## 2021-08-31 MED ORDER — SUGAMMADEX SODIUM 200 MG/2ML IV SOLN
INTRAVENOUS | Status: DC | PRN
  Administered 2021-08-31: 204.2 mg via INTRAVENOUS

## 2021-08-31 MED ORDER — ONDANSETRON HCL 4 MG PO TABS: 4.0000 mg | ORAL_TABLET | Freq: Three times a day (TID) | ORAL | 0 refills | Status: DC | PRN

## 2021-08-31 MED ORDER — DEXAMETHASONE SODIUM PHOSPHATE 10 MG/ML IJ SOLN
INTRAMUSCULAR | Status: AC
  Filled 2021-08-31: qty 1

## 2021-08-31 MED ORDER — BUPIVACAINE-EPINEPHRINE 0.25% -1:200000 IJ SOLN
INTRAMUSCULAR | Status: DC | PRN
  Administered 2021-08-31: 10 mL

## 2021-08-31 MED ORDER — METHYLPREDNISOLONE ACETATE 40 MG/ML INJ SUSP (RADIOLOG
INTRAMUSCULAR | Status: DC | PRN
  Administered 2021-08-31: 40 mg

## 2021-08-31 MED ORDER — CHLORHEXIDINE GLUCONATE 0.12 % MT SOLN: 15.0000 mL | Freq: Once | OROMUCOSAL | Status: AC

## 2021-08-31 MED ORDER — CHLORHEXIDINE GLUCONATE 0.12 % MT SOLN
OROMUCOSAL | Status: AC
  Administered 2021-08-31: 15 mL via OROMUCOSAL
  Filled 2021-08-31: qty 15

## 2021-08-31 MED ORDER — MIDAZOLAM HCL 2 MG/2ML IJ SOLN
INTRAMUSCULAR | Status: DC | PRN
  Administered 2021-08-31: 2 mg via INTRAVENOUS

## 2021-08-31 MED ORDER — FENTANYL CITRATE (PF) 250 MCG/5ML IJ SOLN
INTRAMUSCULAR | Status: DC | PRN
  Administered 2021-08-31: 100 ug via INTRAVENOUS
  Administered 2021-08-31: 50 ug via INTRAVENOUS

## 2021-08-31 MED ORDER — ONDANSETRON HCL 4 MG/2ML IJ SOLN
INTRAMUSCULAR | Status: AC
  Filled 2021-08-31: qty 2

## 2021-08-31 SURGICAL SUPPLY — 66 items
AGENT HMST KT MTR STRL THRMB (HEMOSTASIS) ×1
BAG COUNTER SPONGE SURGICOUNT (BAG) ×2 IMPLANT
BAG SPNG CNTER NS LX DISP (BAG) ×1
BNDG GAUZE ELAST 4 BULKY (GAUZE/BANDAGES/DRESSINGS) ×1 IMPLANT
CANISTER SUCT 3000ML PPV (MISCELLANEOUS) ×2 IMPLANT
CLSR STERI-STRIP ANTIMIC 1/2X4 (GAUZE/BANDAGES/DRESSINGS) ×2 IMPLANT
CNTNR URN SCR LID CUP LEK RST (MISCELLANEOUS) IMPLANT
CONT SPEC 4OZ STRL OR WHT (MISCELLANEOUS) ×2
CORD BIPOLAR FORCEPS 12FT (ELECTRODE) ×2 IMPLANT
COVER SURGICAL LIGHT HANDLE (MISCELLANEOUS) ×2 IMPLANT
DRAIN CHANNEL 15F RND FF W/TCR (WOUND CARE) IMPLANT
DRAPE SURG 17X23 STRL (DRAPES) ×2 IMPLANT
DRAPE U-SHAPE 47X51 STRL (DRAPES) ×2 IMPLANT
DRSG OPSITE POSTOP 3X4 (GAUZE/BANDAGES/DRESSINGS) ×1 IMPLANT
DRSG OPSITE POSTOP 4X6 (GAUZE/BANDAGES/DRESSINGS) ×1 IMPLANT
DRSG TELFA 3X8 NADH (GAUZE/BANDAGES/DRESSINGS) ×2 IMPLANT
DURAPREP 26ML APPLICATOR (WOUND CARE) ×2 IMPLANT
ELECT BLADE 4.0 EZ CLEAN MEGAD (MISCELLANEOUS)
ELECT CAUTERY BLADE 6.4 (BLADE) ×2 IMPLANT
ELECT PENCIL ROCKER SW 15FT (MISCELLANEOUS) ×2 IMPLANT
ELECT REM PT RETURN 9FT ADLT (ELECTROSURGICAL) ×2
ELECTRODE BLDE 4.0 EZ CLN MEGD (MISCELLANEOUS) IMPLANT
ELECTRODE REM PT RTRN 9FT ADLT (ELECTROSURGICAL) ×1 IMPLANT
EVACUATOR SILICONE 100CC (DRAIN) IMPLANT
GLOVE BIO SURGEON STRL SZ 6.5 (GLOVE) ×2 IMPLANT
GLOVE BIOGEL PI IND STRL 6.5 (GLOVE) ×1 IMPLANT
GLOVE BIOGEL PI IND STRL 8.5 (GLOVE) ×1 IMPLANT
GLOVE BIOGEL PI INDICATOR 6.5 (GLOVE) ×1
GLOVE BIOGEL PI INDICATOR 8.5 (GLOVE) ×1
GLOVE SS BIOGEL STRL SZ 8.5 (GLOVE) ×1 IMPLANT
GLOVE SUPERSENSE BIOGEL SZ 8.5 (GLOVE) ×1
GOWN STRL REUS W/ TWL LRG LVL3 (GOWN DISPOSABLE) ×2 IMPLANT
GOWN STRL REUS W/TWL 2XL LVL3 (GOWN DISPOSABLE) ×2 IMPLANT
GOWN STRL REUS W/TWL LRG LVL3 (GOWN DISPOSABLE) ×4
KIT BASIN OR (CUSTOM PROCEDURE TRAY) ×2 IMPLANT
KIT TURNOVER KIT B (KITS) ×2 IMPLANT
NDL 18GX1X1/2 (RX/OR ONLY) (NEEDLE) IMPLANT
NDL SPNL 18GX3.5 QUINCKE PK (NEEDLE) ×2 IMPLANT
NEEDLE 18GX1X1/2 (RX/OR ONLY) (NEEDLE) ×2 IMPLANT
NEEDLE 22X1 1/2 (OR ONLY) (NEEDLE) ×2 IMPLANT
NEEDLE SPNL 18GX3.5 QUINCKE PK (NEEDLE) ×4 IMPLANT
NS IRRIG 1000ML POUR BTL (IV SOLUTION) ×2 IMPLANT
PACK LAMINECTOMY ORTHO (CUSTOM PROCEDURE TRAY) ×2 IMPLANT
PACK UNIVERSAL I (CUSTOM PROCEDURE TRAY) ×2 IMPLANT
PAD ARMBOARD 7.5X6 YLW CONV (MISCELLANEOUS) ×4 IMPLANT
PAD DRESSING TELFA 3X8 NADH (GAUZE/BANDAGES/DRESSINGS) IMPLANT
PATTIES SURGICAL .5 X.5 (GAUZE/BANDAGES/DRESSINGS) ×2 IMPLANT
PATTIES SURGICAL .5 X1 (DISPOSABLE) ×1 IMPLANT
SPONGE SURGIFOAM ABS GEL 100 (HEMOSTASIS) ×1 IMPLANT
SPONGE T-LAP 4X18 ~~LOC~~+RFID (SPONGE) ×5 IMPLANT
SURGIFLO W/THROMBIN 8M KIT (HEMOSTASIS) ×1 IMPLANT
SUT BONE WAX W31G (SUTURE) ×2 IMPLANT
SUT MNCRL+ AB 3-0 CT1 36 (SUTURE) ×1 IMPLANT
SUT MONOCRYL AB 3-0 CT1 36IN (SUTURE)
SUT VIC AB 0 CT1 27 (SUTURE) ×2
SUT VIC AB 0 CT1 27XBRD ANBCTR (SUTURE) IMPLANT
SUT VIC AB 1 CT1 18XCR BRD 8 (SUTURE) ×1 IMPLANT
SUT VIC AB 1 CT1 8-18 (SUTURE) ×4
SUT VIC AB 2-0 CT1 18 (SUTURE) ×2 IMPLANT
SYR BULB IRRIG 60ML STRL (SYRINGE) ×2 IMPLANT
SYR CONTROL 10ML LL (SYRINGE) ×2 IMPLANT
SYR TB 1ML LUER SLIP (SYRINGE) ×1 IMPLANT
TOWEL GREEN STERILE (TOWEL DISPOSABLE) ×2 IMPLANT
TOWEL GREEN STERILE FF (TOWEL DISPOSABLE) ×2 IMPLANT
WATER STERILE IRR 1000ML POUR (IV SOLUTION) ×2 IMPLANT
YANKAUER SUCT BULB TIP NO VENT (SUCTIONS) IMPLANT

## 2021-08-31 NOTE — Anesthesia Preprocedure Evaluation (Signed)
Anesthesia Evaluation  Patient identified by MRN, date of birth, ID band Patient awake    Reviewed: Allergy & Precautions, NPO status , Patient's Chart, lab work & pertinent test results  History of Anesthesia Complications (+) PROLONGED EMERGENCE and history of anesthetic complications  Airway Mallampati: I  TM Distance: >3 FB Neck ROM: Full    Dental  (+) Teeth Intact, Dental Advisory Given   Pulmonary Recent URI ,    breath sounds clear to auscultation       Cardiovascular negative cardio ROS   Rhythm:Regular     Neuro/Psych PSYCHIATRIC DISORDERS Anxiety Depression negative neurological ROS     GI/Hepatic Neg liver ROS, GERD  Medicated and Controlled,  Endo/Other  negative endocrine ROS  Renal/GU negative Renal ROS     Musculoskeletal  (+) Arthritis ,   Abdominal   Peds  Hematology negative hematology ROS (+) Lab Results      Component                Value               Date                      WBC                      8.9                 09/29/2020                HGB                      13.6                09/29/2020                HCT                      39.8                09/29/2020                MCV                      86.3                09/29/2020                PLT                      234                 09/29/2020              Anesthesia Other Findings   Reproductive/Obstetrics Lab Results      Component                Value               Date                      PREGTESTUR                                   08/20/2008            NEGATIVE  THE SENSITIVITY OF THIS METHODOLOGY IS >24 mIU/mL      HCG                      <5.0                03/12/2018            S/p hysterectomy                             Anesthesia Physical Anesthesia Plan  ASA: 2  Anesthesia Plan: General   Post-op Pain Management: Ofirmev IV (intra-op)* and Toradol IV (intra-op)*    Induction: Intravenous  PONV Risk Score and Plan: 3 and Ondansetron and Dexamethasone  Airway Management Planned: Oral ETT  Additional Equipment: None  Intra-op Plan:   Post-operative Plan: Extubation in OR  Informed Consent: I have reviewed the patients History and Physical, chart, labs and discussed the procedure including the risks, benefits and alternatives for the proposed anesthesia with the patient or authorized representative who has indicated his/her understanding and acceptance.     Dental advisory given  Plan Discussed with: CRNA  Anesthesia Plan Comments:         Anesthesia Quick Evaluation

## 2021-08-31 NOTE — Op Note (Signed)
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  BRIEF HISTORY: Christy Le is a 51 y.o. female who presented with significant radicular right leg pain and moderate to severe back pain.  Imaging studies demonstrated a synovial cyst causing significant lateral recess stenosis affecting the exiting L4 and traversing L5 nerve root.  As result of the failure to improve with conservative management we elected to move forward with surgery.  All appropriate risks, benefits, alternatives to surgery were discussed and consent was obtained.  PROCEDURE DETAILS: Patient was brought into the operating room and was properly positioned on the operating room table.  After induction with general anesthesia the patient was endotracheally intubated.  A timeout was taken to confirm all important data: including patient, procedure, and the level. Teds, SCD's were applied.   Patient was turned prone onto the Wilson frame and all bony prominences well-padded.  The back was prepped and draped in a standard fashion.  2 needles were placed in the back and an x-ray was taken for localization of the incision.  I marked out the L4-5 level and infiltrated the area with quarter percent Marcaine with epinephrine.  Midline incision was made and sharp dissection was carried out down to the deep fascia.  I stripped the deep fascia expose the L4 and L5 spinous process as well as the L4-5 facet complex.  A  Penfield 4 was placed underneath the L4 lamina and a second x-ray was taken to confirm I was at the appropriate level.  Once the appropriate level was performed a double-action Leksell rongeur was used to remove the bulk of the L4 lamina.  I then used a 2 and 3 mm Kerrison rongeur to complete the laminotomy on the right side.  Then placed a lamina spreader in the interspinous process space and distracted this level.  This allowed me to dissect through the central raphae of the ligamentum flavum and remove it with a Kerrison rongeur.  I could now visualize the epidural fat and dorsal surface of the thecal sac.  Using a Penfield 4 I gently dissected into the lateral recess below the level of the cyst.  I then continued to resect the ligamentum flavum until I was able to get into the lateral recess.  Using the 2 mm Kerrison rongeur I resected the medial portion of the facet complex in order to further decompress the L5 nerve root in the lateral recess.  I then took this dissection out to the medial border of the L5 pedicle.  I then tracked the L5 nerve root into the foramen of L5 with my nerve hook and made sure it was no longer under compression.  I then continued superiorly in the lateral recess until the synovial cyst came into view.  I then gently dissected with the Penfield 4 around the synovial cyst developing a plane between the thecal sac and the cyst itself.  In order to adequately decompress the cyst I removed more of the L4 right lamina.  This  allowed me to visualize the cephalad portion of the cyst.  Once I was able to dissect around it I used my Kerrison rongeur to remove the cyst.  Samples of the cyst were Then sent to pathology for permanent evaluation.  I then continued removing the cyst until I could visualize the L4 foramen and L4 nerve root.  A foraminotomies was performed in order to further decompress the nerve root.  This point I had an adequate lateral recess decompression starting at the  inferior aspect of the L4 pedicle and proceeding down to the inferior aspect of the L5 pedicle.  Both the L4 and L5 nerve roots were freely mobile and no longer under compression.  The cyst itself was also removed and this provided adequate decompression of the thecal sac on the right side.  I could easily pass my Woodson elevator superiorly inferiorly and medially under the thecal sac confirming an adequate decompression I could also pass the dorsal to the thecal sac.  There was a small amount of the sac wall still adherent to the thecal sac but it was no longer causing compression.  At this point time the thecal sac had now expanded into the laminotomy site and there was no further lateral recess or central stenosis.  Both nerve roots were also adequately decompressed.  I irrigated the wound copiously normal saline and used Floseal and bipolar cautery to obtain and maintain hemostasis.  20 mg (0.5 cc) of Depo-Medrol was then placed for postoperative analgesia and a thrombin-soaked Gelfoam patty was placed over the laminotomy defect.  Retractors were removed and I closed the deep fascia with interrupted #1 Vicryl suture.  I then used a layer of 0 running Vicryl suture, then interrupted 2-0 Vicryl suture, and finally 3-0 Monocryl for the skin.  Steri-Strips and dry dressings were applied and the patient was ultimately extubated and transferred the PACU without incident.  The end of the case all needle sponge counts were correct and there were no adverse intraoperative events.  Venita Lick, MD 08/31/2021 12:30 PM

## 2021-08-31 NOTE — Transfer of Care (Signed)
Immediate Anesthesia Transfer of Care Note  Patient: Christy Le  Procedure(s) Performed: LUMBAR FOUR FIVE DECOMPRESSION AND REMOVAL OF FACET CYST (Right: Spine Lumbar)  Patient Location: PACU  Anesthesia Type:General  Level of Consciousness: awake and alert   Airway & Oxygen Therapy: Patient Spontanous Breathing  Post-op Assessment: Report given to RN and Post -op Vital signs reviewed and stable  Post vital signs: Reviewed and stable  Last Vitals:  Vitals Value Taken Time  BP 111/62 08/31/21 1243  Temp    Pulse 74 08/31/21 1245  Resp 18 08/31/21 1245  SpO2 94 % 08/31/21 1245  Vitals shown include unvalidated device data.  Last Pain:  Vitals:   08/31/21 0914  TempSrc:   PainSc: 5       Patients Stated Pain Goal: 0 (08/31/21 0914)  Complications: No notable events documented.

## 2021-08-31 NOTE — Discharge Instructions (Signed)

## 2021-08-31 NOTE — H&P (Signed)
History:Christy Le is a very pleasant 51 year old woman with significant radicular right leg pain with neurological deficits. 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 Due to the significant pain we are planning on moving forward with an L4-5 decompression and excision of the synovial cyst.  Past Medical History:  Diagnosis Date   Anxiety    Arthritis    Back pain    Complication of anesthesia    trouble waking up after neck surgery per patient   Depression    Gallbladder problem    GERD (gastroesophageal reflux disease)    Hip pain    Joint pain    Pre-diabetes    Prediabetes    Vitamin D deficiency     Allergies  Allergen Reactions   Other Other (See Comments)    Reaction to medication used for sedation during D & C - patient unsure of reaction-   Oxycodone-Acetaminophen Itching, Swelling and Rash    No current facility-administered medications on file prior to encounter.   Current Outpatient Medications on File Prior to Encounter  Medication Sig Dispense Refill   amoxicillin-clavulanate (AUGMENTIN) 875-125 MG tablet Take 1 tablet by mouth 2 (two) times daily.     B-COMPLEX-C PO Take 1 capsule by mouth daily.     Cholecalciferol (VITAMIN D3 ULTRA STRENGTH) 125 MCG (5000 UT) capsule Take 10,000 Units by mouth every morning.     escitalopram (LEXAPRO) 20 MG tablet Take 20 mg by mouth daily.     estradiol (ESTRACE) 2 MG tablet Take 2 mg by mouth daily.     Multiple Vitamin (MULTIVITAMIN) capsule Take 1 capsule by mouth daily.     naproxen sodium (ALEVE) 220 MG tablet Take 440 mg by mouth 2 (two) times daily as needed (pain).     omeprazole (PRILOSEC) 20 MG capsule Take 20 mg by mouth daily.     Vitamin D, Ergocalciferol, (DRISDOL) 1.25 MG (50000 UNIT) CAPS capsule Take 1 capsule (50,000 Units total) by mouth every 7 (seven) days. 4 capsule 0   metoCLOPramide (REGLAN) 10 MG tablet Take 1 tablet (10 mg  total) by mouth every 6 (six) hours as needed for nausea. (Patient not taking: Reported on 08/21/2021) 12 tablet 0    Physical Exam: Vitals:   08/31/21 0906  BP: 133/63  Pulse: 85  Resp: 18  Temp: 98.6 F (37 C)  SpO2: 97%   Body mass index is 34.21 kg/m.  Clinical exam: Christy Le is a pleasant individual, who appears younger than their stated age.  Christy Le is alert and orientated 3.  No shortness of breath, chest pain.  Abdomen is soft and non-tender, negative loss of bowel and bladder control, no rebound tenderness.  Negative: skin lesions abrasions contusions  Peripheral pulses: 2+ dorsalis pedis/posterior tibialis pulses bilaterally. LE compartments are: Soft and nontender.  Gait pattern: Normal  Assistive devices: None  Neuro: Positive numbness and dysesthesias in the right L4 and L5 dermatome. Positive straight leg raise test. 5/5 motor strength in the lower extremity except for right EHL/tibialis anterior. Patient has a right foot drop with 4/5 right EHL/tibialis anterior strength  Musculoskeletal: Significant back pain radiating into the right paraspinal region and right lower extremity. No hip, knee, ankle pain with isolated joint range of motion.  Imaging: Lumbar x-rays demonstrate slight anterior listhesis at L4-5 with mild degenerative disc disease.  MRI of the lumbar spine: completed on 04/27/2021: Degenerative changes with a annular tear at L3-4. Significant  right lateral recess stenosis at L4-5 due to a synovial facet cyst. Positive degenerative disc disease is noted at this level. Mild degeneration at L5-S1.  A/P: Summary: Christy Le presents today with right radicular leg pain. Patient states their pain level is 6-8/10. Christy Le notes that the radicular leg pain is the primary cause of her disabling pain. Christy Le denies any significant low back pain   Diagnosis: Christy Le returns today because of progressive radicular right leg pain. Patient continues to have significant L5  radicular pain due to the synovial cyst at the L4-5 facet. At this point Christy Le is already had injection therapy and unfortunately Christy Le continues to deteriorate. Patient now has a foot drop with radiculopathy and motor deficits. Prior to this Christy Le was having significant back pain in addition to the leg pain which is why we briefly discussed a fusion surgery. While Christy Le continues to have trace anterior listhesis at L4-5 her primary issue is the radicular leg pain due to the synovial cyst.   At this point time I think the best option is a lumbar decompression with removal of the synovial cyst. I did tell her that in the future the degenerative disease can progress or Christy Le can have a worsening spondylolisthesis or develop a contralateral cyst all of which may indicate the need for fusion surgery. But at this point time her principal source of debilitating pain is the radicular leg pain. I have gone over the lumbar decompression and synovial cyst excision with her in great detail and all of her questions were addressed. Christy Le is expressed a desire to move forward with surgery.   Risks and benefits of decompression/discectomy: Infection, bleeding, death, stroke, paralysis, ongoing or worse pain, need for additional surgery, leak of spinal fluid, adjacent segment degeneration requiring additional surgery, post-operative hematoma formation that can result in neurological compromise and the need for urgent/emergent re-operation. Loss in bowel and bladder control. Injury to major vessels that could result in the need for urgent abdominal surgery to stop bleeding. Risk of deep venous thrombosis (DVT) and the need for additional treatment. Recurrent disc herniation resulting in the need for revision surgery, which could include fusion surgery (utilizing instrumentation such as pedicle screws and intervertebral cages).  Additional risk: If instrumentation is used there is a risk of migration, or breakage of that hardware that  could require additional surgery.

## 2021-08-31 NOTE — OR Nursing (Signed)
Level L4-L5 verified by radiologist, Dr Wyman Songster.

## 2021-08-31 NOTE — Brief Op Note (Signed)
08/31/2021  12:40 PM  PATIENT:  Christy Le  51 y.o. female  PRE-OPERATIVE DIAGNOSIS:  Right L4-5 facet cyst  POST-OPERATIVE DIAGNOSIS:  Right L4-5 facet cyst  PROCEDURE:  Procedure(s) with comments: LUMBAR FOUR FIVE DECOMPRESSION AND REMOVAL OF FACET CYST (Right) - 2.5 hrs  SURGEON:  Surgeon(s) and Role:    Venita Lick, MD - Primary  PHYSICIAN ASSISTANT:   ASSISTANTS: none   ANESTHESIA:   general  EBL:  50 mL   BLOOD ADMINISTERED:none  DRAINS: none   LOCAL MEDICATIONS USED:  MARCAINE     SPECIMEN:  Source of Specimen:  L4/5 facet cyst  DISPOSITION OF SPECIMEN:  PATHOLOGY  COUNTS:  YES  TOURNIQUET:  * No tourniquets in log *  DICTATION: .Dragon Dictation  PLAN OF CARE: Admit for overnight observation  PATIENT DISPOSITION:  PACU - hemodynamically stable.

## 2021-08-31 NOTE — Anesthesia Procedure Notes (Signed)
Procedure Name: Intubation Date/Time: 08/31/2021 10:45 AM  Performed by: Minerva Ends, CRNAPre-anesthesia Checklist: Patient identified, Emergency Drugs available, Suction available and Patient being monitored Patient Re-evaluated:Patient Re-evaluated prior to induction Oxygen Delivery Method: Circle system utilized Preoxygenation: Pre-oxygenation with 100% oxygen Induction Type: IV induction Ventilation: Mask ventilation without difficulty Laryngoscope Size: Mac and 3 Grade View: Grade I Tube type: Oral Tube size: 7.0 mm Number of attempts: 1 Airway Equipment and Method: Stylet and Oral airway Placement Confirmation: ETT inserted through vocal cords under direct vision, positive ETCO2 and breath sounds checked- equal and bilateral Secured at: 22 cm Tube secured with: Tape Dental Injury: Teeth and Oropharynx as per pre-operative assessment

## 2021-09-01 ENCOUNTER — Encounter (HOSPITAL_COMMUNITY): Payer: Self-pay | Admitting: Orthopedic Surgery

## 2021-09-01 LAB — SURGICAL PATHOLOGY

## 2021-09-01 NOTE — Anesthesia Postprocedure Evaluation (Signed)
Anesthesia Post Note  Patient: Scheryl Darter  Procedure(s) Performed: LUMBAR FOUR FIVE DECOMPRESSION AND REMOVAL OF FACET CYST (Right: Spine Lumbar)     Patient location during evaluation: PACU Anesthesia Type: General Level of consciousness: awake and alert Pain management: pain level controlled Vital Signs Assessment: post-procedure vital signs reviewed and stable Respiratory status: spontaneous breathing, nonlabored ventilation, respiratory function stable and patient connected to nasal cannula oxygen Cardiovascular status: blood pressure returned to baseline and stable Postop Assessment: no apparent nausea or vomiting Anesthetic complications: no   No notable events documented.  Last Vitals:  Vitals:   08/31/21 1400 08/31/21 1415  BP: 108/69 106/66  Pulse: (!) 59 (!) 54  Resp: 13 14  Temp:  36.7 C  SpO2: 95% 94%    Last Pain:  Vitals:   08/31/21 1245  TempSrc:   PainSc: Asleep   Pain Goal: Patients Stated Pain Goal: 0 (08/31/21 0914)                 Mirren Gest

## 2021-09-13 ENCOUNTER — Encounter (INDEPENDENT_AMBULATORY_CARE_PROVIDER_SITE_OTHER): Payer: Self-pay

## 2021-09-30 ENCOUNTER — Inpatient Hospital Stay
Admission: RE | Admit: 2021-09-30 | Discharge: 2021-09-30 | Disposition: A | Discharge status: Home / Self Care | Payer: BC Managed Care – PPO | Source: Ambulatory Visit

## 2021-10-25 ENCOUNTER — Ambulatory Visit: Payer: BC Managed Care – PPO | Admitting: Dermatology

## 2022-05-28 DIAGNOSIS — Z0289 Encounter for other administrative examinations: Secondary | ICD-10-CM

## 2022-06-13 ENCOUNTER — Ambulatory Visit (INDEPENDENT_AMBULATORY_CARE_PROVIDER_SITE_OTHER): Payer: BC Managed Care – PPO | Admitting: Family Medicine

## 2022-06-13 ENCOUNTER — Encounter (INDEPENDENT_AMBULATORY_CARE_PROVIDER_SITE_OTHER): Payer: Self-pay | Admitting: Family Medicine

## 2022-06-13 VITALS — BP 113/67 | HR 60 | Temp 97.8°F | Ht 68.0 in | Wt 237.0 lb

## 2022-06-13 DIAGNOSIS — F39 Unspecified mood [affective] disorder: Secondary | ICD-10-CM

## 2022-06-13 DIAGNOSIS — K219 Gastro-esophageal reflux disease without esophagitis: Secondary | ICD-10-CM | POA: Diagnosis not present

## 2022-06-13 DIAGNOSIS — Z1331 Encounter for screening for depression: Secondary | ICD-10-CM

## 2022-06-13 DIAGNOSIS — E559 Vitamin D deficiency, unspecified: Secondary | ICD-10-CM | POA: Diagnosis not present

## 2022-06-13 DIAGNOSIS — R5383 Other fatigue: Secondary | ICD-10-CM | POA: Diagnosis not present

## 2022-06-13 DIAGNOSIS — Z6836 Body mass index (BMI) 36.0-36.9, adult: Secondary | ICD-10-CM

## 2022-06-13 DIAGNOSIS — R7303 Prediabetes: Secondary | ICD-10-CM | POA: Diagnosis not present

## 2022-06-13 DIAGNOSIS — R0602 Shortness of breath: Secondary | ICD-10-CM | POA: Diagnosis not present

## 2022-06-13 NOTE — Progress Notes (Signed)
Carlye Grippe, D.O.  ABFM, ABOM Specializing in Clinical Bariatric Medicine Office located at: [address] [place], [place]  [address]     Initial Bariatric Medicine Consultation Visit  Dear Christy Le, [place]*    Thank you for referring Christy Le to our clinic today for evaluation.  We performed a consultation to discuss her options for treatment and educate the patient on her disease state.  The following note includes my evaluation and treatment recommendations.   Please do not hesitate to reach out to me directly if you have any further concerns.    Assessment and Plan:   Orders Placed This Encounter  Procedures   CBC with Differential/Platelet   Vitamin B12   Folate   T4, free   TSH   Insulin, random   EKG 12-Lead    Medications Discontinued During This Encounter  Medication Reason   ondansetron (ZOFRAN) 4 MG tablet Patient Preference   B-COMPLEX-C PO Patient Preference      1) Fatigue Assessment: Condition is Not optimized.Christy Le does feel that her weight is causing her energy to be lower than it should be. Fatigue may be related to obesity, depression or many other causes. she does not appear to have any red flag symptoms and this appears to most likely related to her current lifestyle habits and dietary intake.  Plan:  Labs will be ordered and reviewed with her at their next office visit in two weeks. Epworth sleepiness scale score appears to be within normal limits.  Her ESS score is 6.   Christy Le admits to slight daytime somnolence and admits to waking up still tired.Christy Le generally gets 6-7 hours of sleep per night, and states that she has unrestful sleep due to her pain. She is currently taking Ambien as recommended by pcp/specialist.  Snoring is present. Apneic episodes is not present. I stressed the importance of sleeping 7-9 hrs daily for weight loss. ECG: Sinus bradycardia rate 49 bpm; reassuring without any acute abnormalities,  will continue to monitor for symptoms. Patient endorses having low heart rate and low blood pressure all her life.  Modified PHQ-9 Depression Screen: Her Food and Mood (modified PHQ-9) score was 14. In the meanwhile, Christy Le will focus on self care including making healthy food choices by following their meal plan, improving sleep quality and focusing on stress reduction.  Once we are assured she is on an appropriate meal plan, we will start discussing exercise to increase cardiovascular fitness levels.    2) Shortness of breath on exertion Assessment: Condition is Not optimized. Christy Le does feel that she gets out of breath more easily than she used to when she exercises and seems to be worsening over time with weight gain.  This has gotten worse recently. Christy Le denies shortness of breath at rest or orthopnea. Christy Le's shortness of breath appears to be obesity related and exercise induced, as they do not appear to have any "red flag" symptoms/ concerns today.  Also, this condition appears to be related to a state of poor cardiovascular conditioning   Plan:  Obtain labs today and will be reviewed with her at their next office visit in two weeks. Indirect Calorimeter completed today to help guide our dietary regimen. It shows a VO2 of 269 and a REE of 1858.  Her calculated basal metabolic rate is 6045 thus her measured basal metabolic rate is better than expected. Patient agreed to work on weight loss at this time.  As Nanea progresses through  our weight loss program, we will gradually increase exercise as tolerated to treat her current condition.   If Christy Le follows our recommendations and loses 5-10% of their weight without improvement of her shortness of breath or if at any time, symptoms become more concerning, they agree to urgently follow up with their PCP/ specialist for further consideration/ evaluation.   Christy Le verbalizes agreement with this plan.    Pre-diabetes Assessment: Condition  is not optimized. Lab Results  Component Value Date   HGBA1C 5.7 (H) 06/07/2020   HGBA1C 5.6 07/07/2013   INSULIN 6.5 06/07/2020  - She is currently not taking any prediabetes medication. This is diet controlled.   Plan:  - Begin to decrease simple carbs/ sugars; increase fiber and proteins -> follow her meal plan.   - Christy Le will begin to work on weight loss, exercise, via their meal plan we devised to help decrease the risk of progressing to diabetes.     Gastroesophageal reflux disease, unspecified whether esophagitis present Assessment: Condition is stable. - Patient is compliant with Prilosec 20 mg daily,denies any side effects.  Plan: - Continue with med as recommended by PCP/specialist.  - Treatment plan:  caffeine reduction, dietary changes, elevate HOB, NPO after supper, reduction of alcohol intake, and weight loss. - Will monitor condition as it relates to her weight loss journey.    Vitamin D insufficiency Assessment: Condition is not optimized. Lab Results  Component Value Date   VD25OH 36.7 06/07/2020  - No issues with OTC Vitamin D 10,000 IU daily. Denies any side effects. - She is no longer taking Ergocalciferol 50K IU weekly.  Plan: - Continue with OTC Vitamin D supplement. - Begin her prudent nutritional plan that is low in simple carbohydrates, saturated fats and trans fats to goal of 5-10% weight loss to achieve significant health benefits.    Mood disorder (HCC) - Emotional Eating Assessment: Condition is stable. - Denies any SI/HI. Mood is stable. - Has family history of Bipolar Disorder - Endorses having depression and anxiety. - Reports eating when stressed, bored, as a reward, and to help comfort herself. - She is compliant with Lexapro 20 mg daily as prescribed by her gynecologist Dr.Lowe. Denies any side effects.  Plan:  - Continue with med as recommended by Dr.Lowe.  - I informed patient about to Dr. Dewaine Conger, our Bariatric Psychologist, for  evaluation due to her struggles with emotional eating. Patient declines referral today, but may consider meeting with her in the future. - Begin her prudent nutritional plan that is low in simple carbohydrates, saturated fats and trans fats to goal of 5-10% weight loss to achieve significant health benefits.    TREATMENT PLAN FOR OBESITY: Class 2 severe obesity with serious comorbidity and body mass index (BMI) of 38.0 to 38.9 in adult, unspecified obesity type [facility]) Assessment: Condition is Not optimized. Fat mass is 108.6 lbs Muscle mass is 122.2 lbs. Total body water is 87.4 lbs.   Plan:  Christy Le is currently in the action stage of change. As such, her goal is to continue weight management plan. Kerrian will work on healthier eating habits and Category 2 meal plan and she can  journal 1300-1400 calories and 85+ grams of protein daily if she desires.  Behavioral Intervention Additional resources provided today: category 2 meal plan information and Food journaling plan information Evidence-based interventions for health behavior change were utilized today including the discussion of self monitoring techniques, problem-solving barriers and SMART goal setting techniques.   Regarding patient's less  desirable eating habits and patterns, we employed the technique of small changes.  Pt will specifically work on: beginning prudent nutritional plan for next visit.    Recommended Physical Activity Goals Benicia has been advised to work up to 150 minutes of moderate intensity aerobic activity a week and strengthening exercises 2-3 times per week for cardiovascular health, weight loss maintenance and preservation of muscle mass.  She has agreed to continue their current level of activity  FOLLOW UP: Follow up in 2 weeks. She was informed of the importance of frequent follow up visits to maximize her success with intensive lifestyle modifications for her multiple health conditions.  Christy Le is  aware that we will review all of her lab results at our next visit.  She is aware that if anything is critical/ life threatening with the results, we will be contacting her via MyChart prior to the office visit to discuss management.      Chief Complaint:   OBESITY Christy Le (MR# [id number]) is a 52 y.o. female who presents for evaluation and treatment of obesity and related comorbidities. Current BMI is Body mass index is 36.04 kg/m. Christy Le has been struggling with her weight for many years and has been unsuccessful in either losing weight, maintaining weight loss, or reaching her healthy weight goal.  Christy Le is currently in the action stage of change and ready to dedicate time achieving and maintaining a healthier weight. Anastasi is interested in becoming our patient and working on intensive lifestyle modifications including (but not limited to) diet and exercise for weight loss.  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 Patient is married to Christy Le  and has 2 children. She lives with her husband.  Christy Le's habits were reviewed today and are as follows:   - Desires to lose 60 lbs in a year.   - No regular exercise.  - Weight watchers worked best for her because the meetings helped her and tools were provided.   - Likes to cook, but does not have time to.  - Craves sugary and sweet foods like ice-cream. Snacks on sweets  - Drinks coffee, milk and tea with sugar.  - Worst habit: sweets.   - Her last visit here was on 08/15/20. She had 4 visits with Dr.Bowen.   - Had A1c, CMP, and Lipid Panel on 05/17/2022 with PCP  Subjective:   This is the patient's first visit at [facility].  The patient's NEW PATIENT PACKET that they filled out prior to today's office visit was reviewed at length and information from that paperwork was included within the following office visit note.    Included in the packet: current and past health  history, medications, allergies, ROS, gynecologic history (women only), surgical history, family history, social history, weight history, weight loss surgery history (for those that have had weight loss surgery), nutritional evaluation, mood and food questionnaire along with a depression screening (PHQ9) on all patients, an Epworth questionnaire, sleep habits questionnaire, patient life and health improvement goals questionnaire. These will all be scanned into the patient's chart under the "media" tab.   Review of Systems: Please refer to new patient packet scanned into media. Pertinent positives were addressed with patient today. Objective:   PHYSICAL EXAM: Blood pressure 113/67, pulse 60, temperature 97.8 F (36.6 C), height 5\' 8"  (1.727 m), weight 237 lb (107.5 kg), last menstrual period 11/17/2018, SpO2 96 %. Body mass index is 36.04 kg/m. General: Well  Developed, well nourished, and in no acute distress.  HEENT: Normocephalic, atraumatic Skin: Warm and dry, cap RF less 2 sec, good turgor Chest:  Normal excursion, shape, no gross abn Respiratory: speaking in full sentences, no conversational dyspnea NeuroM-Sk: Ambulates w/o assistance, moves * 4 Psych: A and O *3, insight good, mood-full  Anthropometric Measurements Height: 5\' 8"  (1.727 m) Weight: 237 lb (107.5 kg) BMI (Calculated): 36.04 Weight at Last Visit: N/A Weight Lost Since Last Visit: N/A Weight Gained Since Last Visit: N/A Starting Weight: 237 lb Waist Measurement : 44 inches   Body Composition  Body Fat %: 45.8 % Fat Mass (lbs): 108.6 lbs Muscle Mass (lbs): 122.2 lbs Total Body Water (lbs): 87.4 lbs Visceral Fat Rating : 13   Other Clinical Data Fasting: Yes Labs: Yes Today's Visit #: # 1 Starting Date: 06/13/22 Comments: FIRST VISIT    DIAGNOSTIC DATA REVIEWED:  BMET    Component Value Date/Time   NA 138 09/29/2020 1555   NA 138 06/07/2020 0823   K 4.1 09/29/2020 1555   CL 102 09/29/2020 1555    CO2 29 09/29/2020 1555   GLUCOSE 102 (H) 09/29/2020 1555   BUN 21 (H) 09/29/2020 1555   BUN 16 06/07/2020 0823   CREATININE 0.86 09/29/2020 1555   CALCIUM 9.5 09/29/2020 1555   GFRNONAA >60 09/29/2020 1555   GFRAA >60 12/15/2018 1024   Lab Results  Component Value Date   HGBA1C 5.7 (H) 06/07/2020   HGBA1C 5.6 07/07/2013   Lab Results  Component Value Date   INSULIN 6.5 06/07/2020   Lab Results  Component Value Date   TSH 2.510 06/07/2020   CBC    Component Value Date/Time   WBC 8.9 09/29/2020 1555   RBC 4.61 09/29/2020 1555   HGB 13.6 09/29/2020 1555   HCT 39.8 09/29/2020 1555   PLT 234 09/29/2020 1555   MCV 86.3 09/29/2020 1555   MCH 29.5 09/29/2020 1555   MCHC 34.2 09/29/2020 1555   RDW 13.3 09/29/2020 1555   Iron Studies No results found for: "IRON", "TIBC", "FERRITIN", "IRONPCTSAT" Lipid Panel     Component Value Date/Time   CHOL 154 07/07/2013 1614   TRIG 93.0 07/07/2013 1614   HDL 49.80 07/07/2013 1614   CHOLHDL 3 07/07/2013 1614   VLDL 18.6 07/07/2013 1614   LDLCALC 86 07/07/2013 1614   Hepatic Function Panel     Component Value Date/Time   PROT 7.0 06/07/2020 0823   ALBUMIN 4.6 06/07/2020 0823   AST 25 06/07/2020 0823   ALT 33 (H) 06/07/2020 0823   ALKPHOS 64 06/07/2020 0823   BILITOT 0.4 06/07/2020 0823      Component Value Date/Time   TSH 2.510 06/07/2020 0823   Nutritional Lab Results  Component Value Date   VD25OH 36.7 06/07/2020    Attestation Statements:   Reviewed by clinician on day of visit: allergies, medications, problem list, medical history, surgical history, family history, social history, and previous encounter notes.  During the visit, I independently reviewed the patient's EKG, bioimpedance scale results, and indirect calorimeter results. I used this information to tailor a meal plan for the patient that will help Christy Le to lose weight and will improve her obesity-related conditions going forward.  I performed a  medically necessary appropriate examination and/or evaluation. I discussed the assessment and treatment plan with the patient. The patient was provided an opportunity to ask questions and all were answered. The patient agreed with the plan and demonstrated an understanding of the  instructions. Labs were ordered today (unless patient declined them) and will be reviewed with the patient at our next visit unless more critical results need to be addressed immediately. Clinical information was updated and documented in the EMR.  Time spent on visit including pre-visit chart review and post-visit care was estimated to be 60  minutes.    I,Special Puri,acting as a Neurosurgeon for [employer], DO.,have documented all relevant documentation on the behalf of Thomasene Lot, DO,as directed by  Thomasene Lot, DO while in the presence of Thomasene Lot, DO.   I, Thomasene Lot, DO, have reviewed all documentation for this visit. The documentation on 06/13/22 for the exam, diagnosis, procedures, and orders are all accurate and complete.

## 2022-06-14 LAB — INSULIN, RANDOM: INSULIN: 6.2 u[IU]/mL (ref 2.6–24.9)

## 2022-06-14 LAB — CBC WITH DIFFERENTIAL/PLATELET
Basophils Absolute: 0 10*3/uL (ref 0.0–0.2)
Basos: 1 %
EOS (ABSOLUTE): 0.2 10*3/uL (ref 0.0–0.4)
Eos: 2 %
Hematocrit: 40.6 % (ref 34.0–46.6)
Hemoglobin: 13.1 g/dL (ref 11.1–15.9)
Immature Grans (Abs): 0 10*3/uL (ref 0.0–0.1)
Immature Granulocytes: 0 %
Lymphocytes Absolute: 1.9 10*3/uL (ref 0.7–3.1)
Lymphs: 27 %
MCH: 28.5 pg (ref 26.6–33.0)
MCHC: 32.3 g/dL (ref 31.5–35.7)
MCV: 89 fL (ref 79–97)
Monocytes Absolute: 0.4 10*3/uL (ref 0.1–0.9)
Monocytes: 6 %
Neutrophils Absolute: 4.5 10*3/uL (ref 1.4–7.0)
Neutrophils: 64 %
Platelets: 271 10*3/uL (ref 150–450)
RBC: 4.59 x10E6/uL (ref 3.77–5.28)
RDW: 12.3 % (ref 11.7–15.4)
WBC: 7 10*3/uL (ref 3.4–10.8)

## 2022-06-14 LAB — T4, FREE: Free T4: 0.87 ng/dL (ref 0.82–1.77)

## 2022-06-14 LAB — TSH: TSH: 2.42 u[IU]/mL (ref 0.450–4.500)

## 2022-06-14 LAB — VITAMIN B12: Vitamin B-12: 588 pg/mL (ref 232–1245)

## 2022-06-14 LAB — FOLATE: Folate: 15.2 ng/mL (ref >3.0)

## 2022-06-27 ENCOUNTER — Ambulatory Visit (INDEPENDENT_AMBULATORY_CARE_PROVIDER_SITE_OTHER): Payer: BC Managed Care – PPO | Admitting: Family Medicine

## 2022-06-27 NOTE — Progress Notes (Incomplete)
Carlye Grippe, D.O.  ABFM, ABOM Specializing in Clinical Bariatric Medicine  Office located at: [address] [place], [place]  [address]     Assessment and Plan:   No orders of the defined types were placed in this encounter.   There are no discontinued medications.   No orders of the defined types were placed in this encounter.      There are no diagnoses linked to this encounter.   ***   TREATMENT PLAN FOR OBESITY: Assessment:  Christy Le is here to discuss her progress with her obesity treatment plan along with follow-up of her obesity related diagnoses. See Medical Weight Management Flowsheet for complete bioelectrical impedance results.  Condition is {docourse:29403:::1}. {BiometricData (Optional):29179}  Since last office visit patient's Fat mass has {DID:29233} by ***lb. Muscle mass has {DID:29233} by ***lb. Total body water has {DID:29233} by ***lb.  Counseling done on how various foods will affect these numbers and how to maximize success  Total lbs lost to date: *** Total weight loss percentage to date: *** ( use https://www.[employer].si )   Plan:  Krystalann is currently in the action stage of change. As such, her goal is to continue her weight management plan. Yanixa will work on healthier eating habits and try to follow the {mealplan:29239} best they can.   Behavioral Intervention Additional resources provided today: {weightresources:29185} Evidence-based interventions for health behavior change were utilized today including the discussion of self monitoring techniques, problem-solving barriers and SMART goal setting techniques.   Regarding patient's less desirable eating habits and patterns, we employed the technique of small changes.  Pt will specifically work on: *** for next visit.    Recommended Physical Activity Goals  Noelani has been advised to slowly work up to 150 minutes of moderate intensity  aerobic activity a week and strengthening exercises 2-3 times per week for cardiovascular health, weight loss maintenance and preservation of muscle mass.   She has agreed to {EMEXERCISE:28847::"Think about ways to increase daily physical activity and overcoming barriers to exercise"}  Pharmacotherapy Current Anti-obesity medications: ***. Reported side effects: ***. We discussed various medication options to help Xoe with her weight loss efforts and we both agreed to ***.( or Patient prefers to not start any weight loss medications at this time.   FOLLOW UP: No follow-ups on file.*** She was informed of the importance of frequent follow up visits to maximize her success with intensive lifestyle modifications for her multiple health conditions.    Subjective:   Chief complaint: Obesity [name] is here to discuss her progress with her obesity treatment plan. She is on the {MWMwtlossportion/plan2:23431} and states she is following her eating plan approximately *** % of the time. She states she is exercising *** minutes *** days per week.  Interval History:  TEYANNA GARCIAGONZALEZ is here today for her first follow-up office visit since starting the program with [place].  Since last office visit she ***  All blood work/ lab tests that were recently ordered by myself or an outside provider were reviewed with patient today per their request. Extended time was spent counseling her on all new disease processes that were discovered or preexisting ones that are affected by BMI.  she understands that many of these abnormalities will need to monitored regularly along with the current treatment plan of prudent dietary changes, in which we are making each and every office visit, to improve these health parameters.    Pharmacotherapy for weight loss: She {srtis (Optional):29129} currently taking {  srtpreviousweightlossmeds (Optional):29124} for medical weight loss.  Denies side effects.    Review of Systems:   Pertinent positives were addressed with patient today.   Weight Summary and Biometrics   No data recorded No data recorded  No data recorded No data recorded No data recorded No data recorded   Objective:   PHYSICAL EXAM:  Last menstrual period 11/17/2018. There is no height or weight on file to calculate BMI.  General: Well Developed, well nourished, and in no acute distress.  HEENT: Normocephalic, atraumatic Skin: Warm and dry, cap RF less 2 sec, good turgor Chest:  Normal excursion, shape, no gross abn Respiratory: speaking in full sentences, no conversational dyspnea NeuroM-Sk: Ambulates w/o assistance, moves * 4 Psych: A and O *3, insight good, mood-full  DIAGNOSTIC DATA REVIEWED:  BMET    Component Value Date/Time   NA 138 09/29/2020 1555   NA 138 06/07/2020 0823   K 4.1 09/29/2020 1555   CL 102 09/29/2020 1555   CO2 29 09/29/2020 1555   GLUCOSE 102 (H) 09/29/2020 1555   BUN 21 (H) 09/29/2020 1555   BUN 16 06/07/2020 0823   CREATININE 0.86 09/29/2020 1555   CALCIUM 9.5 09/29/2020 1555   GFRNONAA >60 09/29/2020 1555   GFRAA >60 12/15/2018 1024   Lab Results  Component Value Date   HGBA1C 5.7 (H) 06/07/2020   HGBA1C 5.6 07/07/2013   Lab Results  Component Value Date   INSULIN 6.2 06/13/2022   INSULIN 6.5 06/07/2020   Lab Results  Component Value Date   TSH 2.420 06/13/2022   CBC    Component Value Date/Time   WBC 7.0 06/13/2022 1014   WBC 8.9 09/29/2020 1555   RBC 4.59 06/13/2022 1014   RBC 4.61 09/29/2020 1555   HGB 13.1 06/13/2022 1014   HCT 40.6 06/13/2022 1014   PLT 271 06/13/2022 1014   MCV 89 06/13/2022 1014   MCH 28.5 06/13/2022 1014   MCH 29.5 09/29/2020 1555   MCHC 32.3 06/13/2022 1014   MCHC 34.2 09/29/2020 1555   RDW 12.3 06/13/2022 1014   Iron Studies No results found for: "IRON", "TIBC", "FERRITIN", "IRONPCTSAT" Lipid Panel     Component Value Date/Time   CHOL 154 07/07/2013 1614   TRIG 93.0 07/07/2013 1614   HDL  49.80 07/07/2013 1614   CHOLHDL 3 07/07/2013 1614   VLDL 18.6 07/07/2013 1614   LDLCALC 86 07/07/2013 1614   Hepatic Function Panel     Component Value Date/Time   PROT 7.0 06/07/2020 0823   ALBUMIN 4.6 06/07/2020 0823   AST 25 06/07/2020 0823   ALT 33 (H) 06/07/2020 0823   ALKPHOS 64 06/07/2020 0823   BILITOT 0.4 06/07/2020 0823      Component Value Date/Time   TSH 2.420 06/13/2022 1014   Nutritional Lab Results  Component Value Date   VD25OH 36.7 06/07/2020    Attestations:   Reviewed by clinician on day of visit: allergies, medications, problem list, medical history, surgical history, family history, social history, and previous encounter notes.   Patient was in the office today and time spent on visit including pre-visit chart review and post-visit care/coordination of care and electronic medical record documentation was *** minutes. 50% of the time was in face to face counseling of this patient's medical condition(s) and providing education on treatment options to include the first-line treatment of diet and lifestyle modification.   I,Special Puri,acting as a scribe for [employer], DO.,have documented all relevant documentation on the behalf of Gavin Pound  Opalski, DO,as directed by  Thomasene Lot, DO while in the presence of Thomasene Lot, DO.   I, Thomasene Lot, DO, have reviewed all documentation for this visit. The documentation on 06/27/22 for the exam, diagnosis, procedures, and orders are all accurate and complete.

## 2022-07-20 ENCOUNTER — Other Ambulatory Visit: Payer: Self-pay | Admitting: Physician Assistant

## 2022-07-20 DIAGNOSIS — Z9889 Other specified postprocedural states: Secondary | ICD-10-CM

## 2022-07-30 ENCOUNTER — Ambulatory Visit
Admission: RE | Admit: 2022-07-30 | Discharge: 2022-07-30 | Disposition: A | Discharge status: Home / Self Care | Payer: BC Managed Care – PPO | Source: Ambulatory Visit | Attending: Physician Assistant | Admitting: Physician Assistant

## 2022-07-30 DIAGNOSIS — Z9889 Other specified postprocedural states: Secondary | ICD-10-CM

## 2022-07-30 MED ORDER — GADOPICLENOL 0.5 MMOL/ML IV SOLN
10.0000 mL | Freq: Once | INTRAVENOUS | Status: AC | PRN
  Administered 2022-07-30: 10 mL via INTRAVENOUS

## 2022-10-09 ENCOUNTER — Other Ambulatory Visit: Payer: Self-pay | Admitting: Medical Genetics

## 2022-10-09 DIAGNOSIS — Z006 Encounter for examination for normal comparison and control in clinical research program: Secondary | ICD-10-CM

## 2023-09-20 ENCOUNTER — Other Ambulatory Visit: Payer: Self-pay | Admitting: Family Medicine

## 2023-09-20 ENCOUNTER — Inpatient Hospital Stay: Admission: RE | Admit: 2023-09-20 | Payer: Self-pay | Source: Ambulatory Visit

## 2023-09-20 DIAGNOSIS — K37 Unspecified appendicitis: Secondary | ICD-10-CM

## 2023-09-23 ENCOUNTER — Other Ambulatory Visit: Payer: Self-pay | Admitting: Family Medicine

## 2023-09-23 DIAGNOSIS — R1031 Right lower quadrant pain: Secondary | ICD-10-CM

## 2023-09-24 ENCOUNTER — Other Ambulatory Visit: Payer: Self-pay

## 2023-10-01 ENCOUNTER — Ambulatory Visit
Admission: RE | Admit: 2023-10-01 | Discharge: 2023-10-01 | Disposition: A | Discharge status: Home / Self Care | Source: Ambulatory Visit | Attending: Family Medicine | Admitting: Family Medicine

## 2023-10-01 DIAGNOSIS — R1031 Right lower quadrant pain: Secondary | ICD-10-CM

## 2023-12-05 ENCOUNTER — Other Ambulatory Visit: Payer: Self-pay | Admitting: Medical Genetics

## 2023-12-05 DIAGNOSIS — Z006 Encounter for examination for normal comparison and control in clinical research program: Secondary | ICD-10-CM

## 2023-12-10 ENCOUNTER — Other Ambulatory Visit: Payer: Self-pay

## 2023-12-10 ENCOUNTER — Emergency Department

## 2023-12-10 ENCOUNTER — Emergency Department
Admission: EM | Admit: 2023-12-10 | Discharge: 2023-12-10 | Disposition: A | Discharge status: Home / Self Care | Attending: Emergency Medicine | Admitting: Emergency Medicine

## 2023-12-10 DIAGNOSIS — R42 Dizziness and giddiness: Secondary | ICD-10-CM | POA: Diagnosis present

## 2023-12-10 LAB — URINALYSIS, ROUTINE W REFLEX MICROSCOPIC
Bilirubin Urine: NEGATIVE
Glucose, UA: NEGATIVE mg/dL
Hgb urine dipstick: NEGATIVE
Ketones, ur: NEGATIVE mg/dL
Leukocytes,Ua: NEGATIVE
Nitrite: NEGATIVE
Protein, ur: NEGATIVE mg/dL
Specific Gravity, Urine: 1.016 (ref 1.005–1.030)
pH: 5 (ref 5.0–8.0)

## 2023-12-10 MED ORDER — MECLIZINE HCL 12.5 MG PO TABS: 12.5000 mg | ORAL_TABLET | Freq: Three times a day (TID) | ORAL | 1 refills | Status: AC | PRN

## 2023-12-10 MED ORDER — IBUPROFEN 600 MG PO TABS
600.0000 mg | ORAL_TABLET | Freq: Once | ORAL | Status: AC
  Administered 2023-12-10: 600 mg via ORAL
  Filled 2023-12-10: qty 1

## 2023-12-10 MED ORDER — ACETAMINOPHEN 325 MG PO TABS
650.0000 mg | ORAL_TABLET | Freq: Once | ORAL | Status: AC
  Administered 2023-12-10: 650 mg via ORAL
  Filled 2023-12-10: qty 2

## 2023-12-10 NOTE — ED Notes (Signed)
 See triage note  Presents with vertigo  States she has a hx of the same  Denies any chest pain os SOB   Has had some nausea Provider in treatment room on arrival Family at bedside

## 2023-12-10 NOTE — Discharge Instructions (Addendum)
 The MRI of your brain did not show any acute abnormalities.  Your urinalysis was normal.  I have sent some medication to help with the vertigo.  You can take up to 100 mg/day.  They are in 12.5 mg tablets.  I recommend spacing the dosing out throughout the day.  Please follow-up with the ENT whose information is attached.  Return to the emergency department with any worsening symptoms like dizziness that is persistent for multiple hours.

## 2023-12-10 NOTE — ED Provider Notes (Signed)
 [facility] Provider Note    Event Date/Time   First MD Initiated Contact with Patient 12/10/23 [phone]     (approximate)   History   Dizziness   HPI  Christy Le is a 54 y.o. female with PMH of bipolar disorder, prediabetes, arthritis presents for evaluation of dizziness.  Patient describes it as an intense room spinning sensation.  The episodes last for a few minutes and do completely resolve in between.  She states this current episode began yesterday morning.  She finds that it is worse with specific head movements like looking to the left.  She has had some intermittent tinnitus over the past month.  As well as some headaches.  Does not feel lightheaded.  Has some associated nausea.  Appointment scheduled with ENT towards the end of the month.      Physical Exam   Triage Vital Signs: ED Triage Vitals  Encounter Vitals Group     BP 12/10/23 0802 126/73     Girls Systolic BP Percentile --      Girls Diastolic BP Percentile --      Boys Systolic BP Percentile --      Boys Diastolic BP Percentile --      Pulse Rate 12/10/23 0802 67     Resp 12/10/23 0802 16     Temp 12/10/23 0802 97.6 F (36.4 C)     Temp Source 12/10/23 0802 Oral     SpO2 12/10/23 0802 96 %     Weight 12/10/23 0805 235 lb (106.6 kg)     Height 12/10/23 0805 5' 9 (1.753 m)     Head Circumference --      Peak Flow --      Pain Score 12/10/23 0804 4     Pain Loc --      Pain Education --      Exclude from Growth Chart --     Most recent vital signs: Vitals:   12/10/23 0802  BP: 126/73  Pulse: 67  Resp: 16  Temp: 97.6 F (36.4 C)  SpO2: 96%   General: Awake, tearful. CV:  Good peripheral perfusion.  Resp:  Normal effort.  Abd:  No distention.  Other:  Bilateral TMs are intact and translucent, PERRL, EOM intact, no ataxia, no pronator drift, symmetric facial movements.   ED Results / Procedures / Treatments   Labs (all labs ordered are listed, but only  abnormal results are displayed) Labs Reviewed  URINALYSIS, ROUTINE W REFLEX MICROSCOPIC - Abnormal; Notable for the following components:      Result Value   Color, Urine YELLOW (*)    APPearance HAZY (*)    All other components within normal limits     RADIOLOGY  MR of the brain without contrast obtained, interpreted the images as well as reviewed the radiologist report which did not show any acute abnormalities.  PROCEDURES:  Critical Care performed: No  Procedures   MEDICATIONS ORDERED IN ED: Medications  ibuprofen (ADVIL) tablet 600 mg (600 mg Oral Given 12/10/23 0908)  acetaminophen  (TYLENOL ) tablet 650 mg (650 mg Oral Given 12/10/23 1041)     IMPRESSION / MDM / ASSESSMENT AND PLAN / ED COURSE  I reviewed the triage vital signs and the nursing notes.                             53 year old female presents for evaluation of dizziness.  Vital signs are  stable patient is tearful on exam.  Differential diagnosis includes, but is not limited to, BPPV, orthostatic hypotension, medication side effect, Mnire's, vestibular migraine, vestibular neuronitis, labyrinthitis  Patient's presentation is most consistent with acute, uncomplicated illness.  Physical exam is reassuring, I do not identify any focal neurodeficits.  Since patient's symptoms are intermittent and completely resolved in between episodes, do not feel that this is consistent with a central cause of dizziness, however given that she has had intermittent vertigo for the past 3 months and worsening headaches I did offer MRI of brain which patient would like to proceed with. Lower suspicion for stroke, but MRI would help rule out things like acoustic neuroma or other tumor.  While patient does mention tinnitus, she states this has been intermittent over the past month.  Do not feel that this is necessarily connected to the dizziness she is experiencing today.  Lower suspicion for acoustic neuroma, vestibular neuronitis  and labyrinthitis.  Patient does endorse a headache at this time so this may be due to a vestibular migraine.  MRI of the brain does not show any acute abnormalities.  Urinalysis unremarkable.  I feel that patient's symptoms are most likely due to BPPV as she is experiencing an intense room spinning sensation that is intermittent and is precipitated by head movements.  Will plan to treat patient's symptoms with antiemetics and antihistamines and advised her to follow-up with ENT.  Patient voiced understanding, all questions were answered and she was stable at discharge.     FINAL CLINICAL IMPRESSION(S) / ED DIAGNOSES   Final diagnoses:  Vertigo     Rx / DC Orders   ED Discharge Orders          Ordered    meclizine (ANTIVERT) 12.5 MG tablet  3 times daily PRN        12/10/23 1324             Note:  This document was prepared using Dragon voice recognition software and may include unintentional dictation errors.   Cleaster Tinnie LABOR, PA-C 12/10/23 1327    Dorothyann Drivers, MD 12/10/23 1436

## 2023-12-10 NOTE — ED Triage Notes (Signed)
 Pt to ED for recurrent vertigo symptoms. Has also been nauseous. Current episode started yesterday morning around 430am. States it feels like the room is spinning, worse with head movements. Denies CP, SOB. Has tried dramamine at home, it made her sleep. Has not tried Meclizine yet. Pt is crying. Has upcoming appt with ENT.

## 2024-02-24 ENCOUNTER — Other Ambulatory Visit: Payer: Self-pay | Admitting: Family Medicine

## 2024-02-24 DIAGNOSIS — Z1231 Encounter for screening mammogram for malignant neoplasm of breast: Secondary | ICD-10-CM
